# Patient Record
Sex: Female | Born: 1985
Health system: Southern US, Community
[De-identification: ages and names within clinical notes are randomized; demographics above are authoritative.]

## PROBLEM LIST (undated history)

## (undated) ENCOUNTER — Inpatient Hospital Stay (HOSPITAL_COMMUNITY): Payer: Self-pay

## (undated) DIAGNOSIS — E0590 Thyrotoxicosis, unspecified without thyrotoxic crisis or storm: Secondary | ICD-10-CM

## (undated) HISTORY — PX: NO PAST SURGERIES: SHX2092

---

## 2007-07-28 ENCOUNTER — Emergency Department (HOSPITAL_COMMUNITY): Admission: EM | Admit: 2007-07-28 | Discharge: 2007-07-29 | Payer: Self-pay | Admitting: Pathology

## 2008-10-19 ENCOUNTER — Other Ambulatory Visit: Admission: RE | Admit: 2008-10-19 | Discharge: 2008-10-19 | Payer: Self-pay | Admitting: Pediatrics

## 2009-12-01 ENCOUNTER — Other Ambulatory Visit: Admission: RE | Admit: 2009-12-01 | Discharge: 2009-12-01 | Payer: Self-pay | Admitting: Obstetrics and Gynecology

## 2010-11-18 ENCOUNTER — Other Ambulatory Visit: Payer: Self-pay | Admitting: Obstetrics and Gynecology

## 2010-11-18 ENCOUNTER — Other Ambulatory Visit (HOSPITAL_COMMUNITY)
Admission: RE | Admit: 2010-11-18 | Discharge: 2010-11-18 | Disposition: A | Discharge status: Home / Self Care | Payer: Commercial Indemnity | Source: Ambulatory Visit | Attending: Obstetrics and Gynecology | Admitting: Obstetrics and Gynecology

## 2010-11-18 DIAGNOSIS — Z113 Encounter for screening for infections with a predominantly sexual mode of transmission: Secondary | ICD-10-CM | POA: Insufficient documentation

## 2010-11-18 DIAGNOSIS — Z01419 Encounter for gynecological examination (general) (routine) without abnormal findings: Secondary | ICD-10-CM | POA: Insufficient documentation

## 2011-07-12 LAB — BASIC METABOLIC PANEL
CO2: 24
Calcium: 9.2
Chloride: 106
GFR calc Af Amer: >60
Sodium: 139

## 2011-07-12 LAB — URINALYSIS, ROUTINE W REFLEX MICROSCOPIC
Bilirubin Urine: NEGATIVE
Glucose, UA: NEGATIVE
Hgb urine dipstick: NEGATIVE
Nitrite: NEGATIVE
Protein, ur: NEGATIVE
Specific Gravity, Urine: 1.02
Urobilinogen, UA: 1
pH: 6

## 2011-07-12 LAB — PREGNANCY, URINE: Preg Test, Ur: NEGATIVE

## 2011-12-19 ENCOUNTER — Other Ambulatory Visit (HOSPITAL_COMMUNITY)
Admission: RE | Admit: 2011-12-19 | Discharge: 2011-12-19 | Disposition: A | Discharge status: Home / Self Care | Payer: Commercial Indemnity | Source: Ambulatory Visit | Attending: Obstetrics and Gynecology | Admitting: Obstetrics and Gynecology

## 2011-12-19 ENCOUNTER — Other Ambulatory Visit: Payer: Self-pay | Admitting: Obstetrics and Gynecology

## 2011-12-19 DIAGNOSIS — Z113 Encounter for screening for infections with a predominantly sexual mode of transmission: Secondary | ICD-10-CM | POA: Insufficient documentation

## 2011-12-19 DIAGNOSIS — Z01419 Encounter for gynecological examination (general) (routine) without abnormal findings: Secondary | ICD-10-CM | POA: Insufficient documentation

## 2014-02-05 ENCOUNTER — Other Ambulatory Visit: Payer: Self-pay | Admitting: Nurse Practitioner

## 2014-02-05 ENCOUNTER — Other Ambulatory Visit (HOSPITAL_COMMUNITY)
Admission: RE | Admit: 2014-02-05 | Discharge: 2014-02-05 | Disposition: A | Discharge status: Home / Self Care | Payer: BC Managed Care – PPO | Source: Ambulatory Visit | Attending: Obstetrics and Gynecology | Admitting: Obstetrics and Gynecology

## 2014-02-05 DIAGNOSIS — Z01419 Encounter for gynecological examination (general) (routine) without abnormal findings: Secondary | ICD-10-CM | POA: Insufficient documentation

## 2014-07-08 ENCOUNTER — Emergency Department (HOSPITAL_COMMUNITY)
Admission: EM | Admit: 2014-07-08 | Discharge: 2014-07-08 | Disposition: A | Discharge status: Home / Self Care | Payer: BC Managed Care – PPO | Attending: Emergency Medicine | Admitting: Emergency Medicine

## 2014-07-08 ENCOUNTER — Encounter (HOSPITAL_COMMUNITY): Payer: Self-pay | Admitting: Emergency Medicine

## 2014-07-08 DIAGNOSIS — Z79899 Other long term (current) drug therapy: Secondary | ICD-10-CM | POA: Insufficient documentation

## 2014-07-08 DIAGNOSIS — J029 Acute pharyngitis, unspecified: Secondary | ICD-10-CM | POA: Diagnosis not present

## 2014-07-08 DIAGNOSIS — M549 Dorsalgia, unspecified: Secondary | ICD-10-CM | POA: Insufficient documentation

## 2014-07-08 LAB — RAPID STREP SCREEN (MED CTR MEBANE ONLY): Streptococcus, Group A Screen (Direct): NEGATIVE

## 2014-07-08 MED ORDER — HYDROCOD POLST-CHLORPHEN POLST 10-8 MG/5ML PO LQCR: 5.0000 mL | Freq: Two times a day (BID) | ORAL | Status: DC | PRN

## 2014-07-08 NOTE — ED Notes (Signed)
Pt sts she has been experiencing a sore throat with ear pain and some back pain since Monday (10/5)

## 2014-07-08 NOTE — ED Provider Notes (Signed)
CSN: [id number]     Arrival date & time 07/08/14  1304 History  This chart was scribed for Oswaldo ConroyVictoria Keanthony Poole, [place] with Shon Batonourtney F Horton, MD by Tonye RoyaltyJoshua Chen, ED Scribe. This patient was seen in room WTR9/WTR9 and the patient's care was started at 2:58 PM.    Chief Complaint  Patient presents with  . Sore Throat   The history is provided by the patient. No language interpreter was used.   HPI Comments: Margaret Mason is a 28 y.o. female who presents to the Emergency Department complaining of sore throat with onset 2 days ago with associated right ear pain and soreness to her back. She reports pain with swallowing. She reports taking Ibuprofen that improved her back pain; she also reports using Chloraseptic. She also reports associated chills, fever, and non-productive cough. She denies rhinorrhea, chest pain, SOB, difficulty breathing, nausea, vomiting, or abdominal pain.  History reviewed. No pertinent past medical history. History reviewed. No pertinent past surgical history. History reviewed. No pertinent family history. History  Substance Use Topics  . Smoking status: Never Smoker   . Smokeless tobacco: Not on file  . Alcohol Use: Yes     Comment: socially   OB History   Grav Para Term Preterm Abortions TAB SAB Ect Mult Living                 Review of Systems  Constitutional: Positive for fever and chills.  HENT: Positive for ear pain and sore throat. Negative for rhinorrhea.   Respiratory: Positive for cough. Negative for shortness of breath.   Cardiovascular: Negative for chest pain.  Gastrointestinal: Negative for nausea, vomiting and abdominal pain.  Musculoskeletal: Positive for back pain.      Allergies  Review of patient's allergies indicates no known allergies.  Home Medications   Prior to Admission medications   Medication Sig Start Date End Date Taking? Authorizing Provider  Acetaminophen (CHLORASEPTIC SORE THROAT PO) Take 2 sprays by mouth 2 (two) times daily as  needed (sore throat).   Yes Historical Provider, MD  Drospirenone-Ethinyl Estradiol-Levomefol (BEYAZ) 3-0.02-0.451 MG tablet Take 1 tablet by mouth daily.   Yes Historical Provider, MD  ibuprofen (ADVIL,MOTRIN) 800 MG tablet Take 800 mg by mouth every 8 (eight) hours as needed for moderate pain (throat, ear & back pain).   Yes Historical Provider, MD  chlorpheniramine-HYDROcodone (TUSSIONEX PENNKINETIC ER) 10-8 MG/5ML LQCR Take 5 mLs by mouth every 12 (twelve) hours as needed for cough (Cough). 07/08/14   Benetta SparVictoria L Kimberli Winne, PA-C   BP 120/66  Pulse 109  Temp(Src) 99.9 F (37.7 C) (Oral)  Ht 5\' 1"  (1.549 m)  Wt 185 lb (83.915 kg)  BMI 34.97 kg/m2  SpO2 100%  LMP 06/27/2014 Physical Exam  Nursing note and vitals reviewed. Constitutional: She appears well-developed and well-nourished. No distress.  HENT:  Head: Normocephalic and atraumatic.  No trismus. No frontal or maxillary sinus tenderness, Tonsils erythematous, swollen, and with some pus. No tenderness under tongue. No erythema or bulging TMs, not tender.   Eyes: Conjunctivae are normal. Right eye exhibits no discharge. Left eye exhibits no discharge.  Neck:  No masses  Pulmonary/Chest: Effort normal. No respiratory distress.  Lymphadenopathy:    She has no cervical adenopathy.  Neurological: She is alert. Coordination normal.  Skin: She is not diaphoretic.  Psychiatric: She has a normal mood and affect. Her behavior is normal.    ED Course  Procedures (including critical care time) Labs Review Labs Reviewed  RAPID STREP SCREEN  CULTURE, GROUP A STREP    Imaging Review No results found.   EKG Interpretation None     DIAGNOSTIC STUDIES: Oxygen Saturation is 100% on room air, normal by my interpretation.    COORDINATION OF CARE: 3:05 PM Her strep test is negative, so I believe that this is most likely viral. Her throat exam is also consistent with viral pharyngitis. I discussed with her my plans to discharge with  instructions to sleep well, drink lots of fluids with electrolytes, and use cough syrup along with the medications she is already taking. I instructed her to return if she experiences acute headache, fever, or if her cough becomes productive. The patient agrees with the plan and has no further questions at this time.  Meds given in ED:  Medications - No data to display  Discharge Medication List as of 07/08/2014  3:09 PM    START taking these medications   Details  chlorpheniramine-HYDROcodone (TUSSIONEX PENNKINETIC ER) 10-8 MG/5ML LQCR Take 5 mLs by mouth every 12 (twelve) hours as needed for cough (Cough)., Starting 07/08/2014, Until Discontinued, Print          MDM   Final diagnoses:  Viral pharyngitis   Pt afebrile with minimal tonsillar exudate, negative strep. Presents with no cervical lymphadenopathy, & dysphagia; diagnosis of viral pharyngitis. No abx indicated. DC w symptomatic tx for pain  Pt does not appear dehydrated, but did discuss importance of water rehydration. Presentation non concerning for PTA or infxn spread to soft tissue. No trismus or uvula deviation. Specific return precautions discussed. Recommended PCP follow up.  Discussed return precautions with patient. Discussed all results and patient verbalizes understanding and agrees with plan.  I personally performed the services described in this documentation, which was scribed in my presence. The recorded information has been reviewed and is accurate.   Louann Sjogren, PA-C 07/08/14 2329

## 2014-07-08 NOTE — Discharge Instructions (Signed)
Return to the emergency room with worsening of symptoms, new symptoms or with symptoms that are concerning, especially fevers, productive cough with thick colored mucous or blood, stiff neck, nausea/vomiting, visual changes or slurred speech, chest pain, shortness of breath. Continue to use OTC pain medications.  Cough syrup Do not operate machinery, drive or drink alcohol while taking cough syrup.

## 2014-07-09 NOTE — ED Provider Notes (Signed)
Medical screening examination/treatment/procedure(s) were performed by non-physician practitioner and as supervising physician I was immediately available for consultation/collaboration.   EKG Interpretation None        Onita Pfluger F Roshaun Pound, MD 07/09/14 0656 

## 2014-07-10 LAB — CULTURE, GROUP A STREP

## 2017-05-13 ENCOUNTER — Emergency Department (HOSPITAL_COMMUNITY): Payer: Self-pay

## 2017-05-13 ENCOUNTER — Emergency Department (HOSPITAL_COMMUNITY)
Admission: EM | Admit: 2017-05-13 | Discharge: 2017-05-13 | Disposition: A | Discharge status: Home / Self Care | Payer: Self-pay | Attending: Emergency Medicine | Admitting: Emergency Medicine

## 2017-05-13 ENCOUNTER — Encounter (HOSPITAL_COMMUNITY): Payer: Self-pay

## 2017-05-13 DIAGNOSIS — R109 Unspecified abdominal pain: Secondary | ICD-10-CM | POA: Insufficient documentation

## 2017-05-13 DIAGNOSIS — Z3A01 Less than 8 weeks gestation of pregnancy: Secondary | ICD-10-CM | POA: Insufficient documentation

## 2017-05-13 LAB — COMPREHENSIVE METABOLIC PANEL
ALBUMIN: 3.8 g/dL (ref 3.5–5.0)
ALK PHOS: 58 U/L (ref 38–126)
ALT: 15 U/L (ref 14–54)
AST: 22 U/L (ref 15–41)
Anion gap: 8 (ref 5–15)
BILIRUBIN TOTAL: 0.6 mg/dL (ref 0.3–1.2)
BUN: 6 mg/dL (ref 6–20)
CALCIUM: 8.8 mg/dL — AB (ref 8.9–10.3)
CO2: 24 mmol/L (ref 22–32)
CREATININE: 0.81 mg/dL (ref 0.44–1.00)
Chloride: 108 mmol/L (ref 101–111)
GFR calc Af Amer: >60 mL/min (ref >60)
GLUCOSE: 92 mg/dL (ref 65–99)
POTASSIUM: 3.8 mmol/L (ref 3.5–5.1)
Sodium: 140 mmol/L (ref 135–145)
TOTAL PROTEIN: 8.2 g/dL — AB (ref 6.5–8.1)

## 2017-05-13 LAB — I-STAT BETA HCG BLOOD, ED (MC, WL, AP ONLY): I-stat hCG, quantitative: 265.3 m[IU]/mL — ABNORMAL HIGH (ref <5)

## 2017-05-13 LAB — CBC
HEMATOCRIT: 40.5 % (ref 36.0–46.0)
Hemoglobin: 14.4 g/dL (ref 12.0–15.0)
MCH: 29.1 pg (ref 26.0–34.0)
MCHC: 35.6 g/dL (ref 30.0–36.0)
MCV: 82 fL (ref 78.0–100.0)
PLATELETS: 396 10*3/uL (ref 150–400)
RBC: 4.94 MIL/uL (ref 3.87–5.11)
RDW: 15 % (ref 11.5–15.5)
WBC: 8.5 10*3/uL (ref 4.0–10.5)

## 2017-05-13 LAB — URINALYSIS, ROUTINE W REFLEX MICROSCOPIC
BILIRUBIN URINE: NEGATIVE
Glucose, UA: NEGATIVE mg/dL
Hgb urine dipstick: NEGATIVE
KETONES UR: NEGATIVE mg/dL
LEUKOCYTES UA: NEGATIVE
NITRITE: NEGATIVE
PH: 5 (ref 5.0–8.0)
PROTEIN: NEGATIVE mg/dL
Specific Gravity, Urine: 1.017 (ref 1.005–1.030)

## 2017-05-13 LAB — WET PREP, GENITAL
SPERM: NONE SEEN
Trich, Wet Prep: NONE SEEN
YEAST WET PREP: NONE SEEN

## 2017-05-13 LAB — LIPASE, BLOOD: Lipase: 22 U/L (ref 11–51)

## 2017-05-13 LAB — TSH: TSH: 3.361 u[IU]/mL (ref 0.350–4.500)

## 2017-05-13 LAB — HCG, QUANTITATIVE, PREGNANCY: hCG, Beta Chain, Quant, S: 291 m[IU]/mL — ABNORMAL HIGH (ref <5)

## 2017-05-13 MED ORDER — DICYCLOMINE HCL 10 MG PO CAPS: 10.0000 mg | ORAL_CAPSULE | Freq: Once | ORAL | Status: DC

## 2017-05-13 NOTE — Discharge Instructions (Signed)
As we discussed, you need to follow up with your OB/GYN in 2 days for repeat blood tests to make sure that it is increasing normally.  As your OB/GYN directed, take 81 mg of baby aspirin a day. He didn't find this at a department store.  Take Tylenol as directed for pain.  Return to the emergency department for any worsening abdominal pain, nausea/vomiting, fever, vaginal bleeding, vaginal discharge or any other worsening or concerning symptoms.

## 2017-05-13 NOTE — ED Notes (Signed)
US at bedside

## 2017-05-13 NOTE — ED Provider Notes (Signed)
[facility]-EMERGENCY DEPT Provider Note   CSN: [id number] Arrival date & time: 05/13/17  0865     History   Chief Complaint No chief complaint on file.   HPI Margaret Mason is a 31 y.o. female who presents with 2 days of suprapubic abdominal cramping. Patient states the pain is nonradiating and describes it as a "sharp, cramping," that is an 8/10. Patient states that she has taken Tylenol and ibuprofen with temporary improvement in symptoms. Patient denies any other alleviating or aggravating factors. She has been able to eat and drink without any difficulty. She's been tolerating by mouth. Patient states that pain is not associated with any food intake. Patient reports some mild nausea but states that she has been able to tolerate by mouth denies any vomiting. Patient reports her last menstrual cycle was 04/10/17. Patient is currently sexually active with one partner. They do not use any protection. Patient denies any history of STDs. Patient does not use any birth control. Patient denies any fevers/chills, chest pain, difficulty breathing, dysuria, hematuria, vomiting, difficulty eating, vaginal discharge, vaginal bleeding.  The history is provided by the patient.    History reviewed. No pertinent past medical history.  There are no active problems to display for this patient.   History reviewed. No pertinent surgical history.  OB History    No data available       Home Medications    Prior to Admission medications   Medication Sig Start Date End Date Taking? Authorizing Provider  acetaminophen (TYLENOL) 500 MG tablet Take 1,000 mg by mouth every 6 (six) hours as needed.   Yes [provider]    Family History No family history on file.  Social History Social History  Substance Use Topics  . Smoking status: Never Smoker  . Smokeless tobacco: Never Used  . Alcohol use No     Comment: socially     Allergies   Patient has no known allergies.   Review of  Systems Review of Systems  Constitutional: Negative for chills and fever.  HENT: Negative for congestion, rhinorrhea and sore throat.   Eyes: Negative for visual disturbance.  Respiratory: Negative for cough and shortness of breath.   Cardiovascular: Negative for chest pain.  Gastrointestinal: Positive for abdominal pain. Negative for blood in stool, diarrhea, nausea and vomiting.  Genitourinary: Negative for dysuria and hematuria.  Musculoskeletal: Negative for back pain and neck pain.  Skin: Negative for rash.  Neurological: Negative for dizziness, weakness, numbness and headaches.  Psychiatric/Behavioral: Negative for confusion.  All other systems reviewed and are negative.    Physical Exam Updated Vital Signs BP 138/87   Pulse 81   Temp 98.1 F (36.7 C) (Oral)   Resp 16   Ht 5\' 1"  (1.549 m)   Wt 104.3 kg (230 lb)   LMP 04/10/2017   SpO2 100%   BMI 43.46 kg/m   Physical Exam  Constitutional: She is oriented to person, place, and time. She appears well-developed and well-nourished.  Sitting comfortably on examination table  HENT:  Head: Normocephalic and atraumatic.  Mouth/Throat: Oropharynx is clear and moist and mucous membranes are normal.  Eyes: Pupils are equal, round, and reactive to light. Conjunctivae, EOM and lids are normal.  Neck: Full passive range of motion without pain.  Cardiovascular: Normal rate, regular rhythm, normal heart sounds and normal pulses.  Exam reveals no gallop and no friction rub.   No murmur heard. Pulmonary/Chest: Effort normal and breath sounds normal.  Abdominal: Soft. Normal appearance.  There is tenderness in the suprapubic area. There is no rigidity, no guarding and no CVA tenderness.  No CVA tenderness bilaterally.  Genitourinary: Vagina normal and uterus normal. Cervix exhibits no motion tenderness, no discharge and no friability. Right adnexum displays no tenderness. Left adnexum displays no tenderness. No bleeding in the vagina.  No vaginal discharge found.  Genitourinary Comments: The exam was performed with a chaperone present. Normal external female genitalia. No evidence of lesions, rashes, sores.  Musculoskeletal: Normal range of motion.  Neurological: She is alert and oriented to person, place, and time.  Skin: Skin is warm and dry. Capillary refill takes less than 2 seconds.  Psychiatric: She has a normal mood and affect. Her speech is normal.  Nursing note and vitals reviewed.    ED Treatments / Results  Labs (all labs ordered are listed, but only abnormal results are displayed) Labs Reviewed  WET PREP, GENITAL - Abnormal; Notable for the following:       Result Value   Clue Cells Wet Prep HPF POC PRESENT (*)    WBC, Wet Prep HPF POC MANY (*)    All other components within normal limits  COMPREHENSIVE METABOLIC PANEL - Abnormal; Notable for the following:    Calcium 8.8 (*)    Total Protein 8.2 (*)    All other components within normal limits  HCG, QUANTITATIVE, PREGNANCY - Abnormal; Notable for the following:    hCG, Beta Chain, Quant, S 291 (*)    All other components within normal limits  I-STAT BETA HCG BLOOD, ED ([facility], [facility], [facility] ONLY) - Abnormal; Notable for the following:    I-stat hCG, quantitative 265.3 (*)    All other components within normal limits  LIPASE, BLOOD  CBC  URINALYSIS, ROUTINE W REFLEX MICROSCOPIC  TSH  GC/CHLAMYDIA PROBE AMP ([place]) NOT AT [facility]    EKG  EKG Interpretation None       Radiology [place] Ob Comp < 14 Wks  Result Date: 05/13/2017 CLINICAL DATA:  Pelvic pain for 3 days, pregnant, quantitative beta HCG = 291 EXAM: OBSTETRIC <14 WK US AND TRANSVAGINAL OB US TECHNIQUE: Both transabdominal and transvaginal ultrasound examinations were performed for complete evaluation of the gestation as well as the maternal uterus, adnexal regions, and pelvic cul-de-sac. Transvaginal technique was performed to assess early pregnancy. COMPARISON:  None FINDINGS: Intrauterine  gestational sac: None identified Yolk sac:  N/A Embryo:  N/A Cardiac Activity: N/A Heart Rate: N/A  bpm Maternal uterus/adnexae: Prominent endometrial complex without gestational sac. No endometrial fluid. Trace free pelvic fluid. LEFT ovary normal size and morphology, 3.4 x 1.2 x 2.1 cm. RIGHT ovary measures 5.1 x 2.6 x 2.6 cm and contains a dominant follicle 1.7 cm diameter as well as a probable hemorrhagic corpus luteum 1.9 x 1.5 x 1.9 cm. No adnexal masses otherwise seen. IMPRESSION: No intrauterine gestation identified. Findings are compatible with pregnancy of unknown location. Differential diagnosis includes early intrauterine pregnancy too early to visualize, spontaneous abortion, and ectopic pregnancy. Serial quantitative beta HCG and or followup ultrasound recommended to definitively exclude ectopic pregnancy. Electronically Signed   By: Ulyses SouthwardMark  Boles M.D.   On: 05/13/2017 14:12   [place] Ob Transvaginal  Result Date: 05/13/2017 CLINICAL DATA:  Pelvic pain for 3 days, pregnant, quantitative beta HCG = 291 EXAM: OBSTETRIC <14 WK US AND TRANSVAGINAL OB US TECHNIQUE: Both transabdominal and transvaginal ultrasound examinations were performed for complete evaluation of the gestation as well as the maternal uterus, adnexal regions, and pelvic cul-de-sac.  Transvaginal technique was performed to assess early pregnancy. COMPARISON:  None FINDINGS: Intrauterine gestational sac: None identified Yolk sac:  N/A Embryo:  N/A Cardiac Activity: N/A Heart Rate: N/A  bpm Maternal uterus/adnexae: Prominent endometrial complex without gestational sac. No endometrial fluid. Trace free pelvic fluid. LEFT ovary normal size and morphology, 3.4 x 1.2 x 2.1 cm. RIGHT ovary measures 5.1 x 2.6 x 2.6 cm and contains a dominant follicle 1.7 cm diameter as well as a probable hemorrhagic corpus luteum 1.9 x 1.5 x 1.9 cm. No adnexal masses otherwise seen. IMPRESSION: No intrauterine gestation identified. Findings are compatible with  pregnancy of unknown location. Differential diagnosis includes early intrauterine pregnancy too early to visualize, spontaneous abortion, and ectopic pregnancy. Serial quantitative beta HCG and or followup ultrasound recommended to definitively exclude ectopic pregnancy. Electronically Signed   By: Ulyses Southward M.D.   On: 05/13/2017 14:12    Procedures Procedures (including critical care time)  Medications Ordered in ED Medications - No data to display   Initial Impression / Assessment and Plan / ED Course  I have reviewed the triage vital signs and the nursing notes.  Pertinent labs & imaging results that were available during my care of the patient were reviewed by me and considered in my medical decision making (see chart for details).     31 year old female who presents with 2 days of suprapubic abdominal cramping. Patient is afebrile, non-toxic appearing, sitting comfortably on examination table. Vital signs reviewed and stable. Mild tenderness to suprapubic region on abdominal exam. Otherwise unremarkable. Consider pregnancy versus UTI versus infectious etiology, though low suspicion given lack of infectious symptoms. Initial labs ordered at triage, including CMP, CBC, lipase, UA, i-STAT beta Quant.   Labs reviewed. I-STAT beta is positive. Will plan to get beta Quant for further evaluation. UA is negative. CMP is unremarkable. CBC unremarkable. Lipase unremarkable. Discussed results with patient. Will plan to do pelvic exam.  Pelvic exam as documented above. Not concerning for PID. Based on LMP, would be 4 weeks, 5 days. Given early pregnancy cannot be  confirm intrauterine pregnancy on U/S. Will plan to obtain ultrasound for further evaluation of any ovary pathology.   U/S reviewed. Cannot determine if there is a early intrauterine pregnancy given early findings. Cannot rule out ectopic and spontaneous abortion. Do not suspect ectopic pregnancy given reassuring vitals, well appearance  and pelvic exam findings. Discussed results with patient. We will plan to have her follow-up with OB/GYN in 2 days for repeat beta. Will plan to consult her OB/GYN, [facility] physicians.  Discussed with Dr. Dion [facility]) regarding workup in ED today. Agrees with plan to discharge patient and have her follow-up in 48 hours. We'll arrange for an appointment on 05/15/17 to have repeat beta. Will plan to evaluate patient in the office later this week. Instructed that patient be discharged home on 81 mg of baby aspirin daily.  Discussed plan with patient. Vital signs reviewed and stable. Patient has been able to tolerate by mouth in the emergency department without any difficulty. Patient is hemodynamically stable. Patient stable for discharge at this time. Instructed patient to call her OB/GYN to arrange for an appointment in 48 hours. Conservative therapies discussed. Strict return precautions discussed. Patient expresses understanding and agreement to plan.    Final Clinical Impressions(s) / ED Diagnoses   Final diagnoses:  Abdominal cramping  Less than [redacted] weeks gestation of pregnancy    New Prescriptions Discharge Medication List as of 05/13/2017  3:13 PM  Maxwell Caul, PA-C 05/13/17 1744    Charlynne Pander, MD 05/15/17 [phone]

## 2017-05-13 NOTE — ED Triage Notes (Signed)
Patient present with c/o abdominal pain started Friday. Pt state her pain is sharp and rate it 8/10.

## 2017-05-14 LAB — GC/CHLAMYDIA PROBE AMP (~~LOC~~) NOT AT ARMC
CHLAMYDIA, DNA PROBE: NEGATIVE
NEISSERIA GONORRHEA: NEGATIVE

## 2017-06-01 ENCOUNTER — Encounter (HOSPITAL_COMMUNITY): Payer: Self-pay | Admitting: *Deleted

## 2017-06-01 ENCOUNTER — Inpatient Hospital Stay (HOSPITAL_COMMUNITY): Payer: 59

## 2017-06-01 ENCOUNTER — Inpatient Hospital Stay (HOSPITAL_COMMUNITY)
Admission: AD | Admit: 2017-06-01 | Discharge: 2017-06-01 | Disposition: A | Discharge status: Home / Self Care | Payer: 59 | Source: Ambulatory Visit | Attending: Obstetrics & Gynecology | Admitting: Obstetrics & Gynecology

## 2017-06-01 DIAGNOSIS — R102 Pelvic and perineal pain: Secondary | ICD-10-CM | POA: Insufficient documentation

## 2017-06-01 DIAGNOSIS — Z79899 Other long term (current) drug therapy: Secondary | ICD-10-CM | POA: Diagnosis not present

## 2017-06-01 DIAGNOSIS — O26891 Other specified pregnancy related conditions, first trimester: Secondary | ICD-10-CM | POA: Diagnosis not present

## 2017-06-01 DIAGNOSIS — N898 Other specified noninflammatory disorders of vagina: Secondary | ICD-10-CM | POA: Diagnosis not present

## 2017-06-01 DIAGNOSIS — Z3A01 Less than 8 weeks gestation of pregnancy: Secondary | ICD-10-CM | POA: Diagnosis not present

## 2017-06-01 DIAGNOSIS — O209 Hemorrhage in early pregnancy, unspecified: Secondary | ICD-10-CM | POA: Diagnosis present

## 2017-06-01 HISTORY — DX: Thyrotoxicosis, unspecified without thyrotoxic crisis or storm: E05.90

## 2017-06-01 LAB — URINALYSIS, ROUTINE W REFLEX MICROSCOPIC
Bilirubin Urine: NEGATIVE
GLUCOSE, UA: NEGATIVE mg/dL
Ketones, ur: NEGATIVE mg/dL
Leukocytes, UA: NEGATIVE
Nitrite: NEGATIVE
PH: 5 (ref 5.0–8.0)
PROTEIN: NEGATIVE mg/dL
Specific Gravity, Urine: 1.015 (ref 1.005–1.030)

## 2017-06-01 LAB — HCG, QUANTITATIVE, PREGNANCY: HCG, BETA CHAIN, QUANT, S: 54508 m[IU]/mL — AB (ref <5)

## 2017-06-01 NOTE — Discharge Instructions (Signed)
Vaginal Bleeding During Pregnancy, First Trimester °A small amount of bleeding (spotting) from the vagina is common in early pregnancy. Sometimes the bleeding is normal and is not a problem, and sometimes it is a sign of something serious. Be sure to tell your doctor about any bleeding from your vagina right away. °Follow these instructions at home: °· Watch your condition for any changes. °· Follow your doctor's instructions about how active you can be. °· If you are on bed rest: °? You may need to stay in bed and only get up to use the bathroom. °? You may be allowed to do some activities. °? If you need help, make plans for someone to help you. °· Write down: °? The number of pads you use each day. °? How often you change pads. °? How soaked (saturated) your pads are. °· Do not use tampons. °· Do not douche. °· Do not have sex or orgasms until your doctor says it is okay. °· If you pass any tissue from your vagina, save the tissue so you can show it to your doctor. °· Only take medicines as told by your doctor. °· Do not take aspirin because it can make you bleed. °· Keep all follow-up visits as told by your doctor. °Contact a doctor if: °· You bleed from your vagina. °· You have cramps. °· You have labor pains. °· You have a fever that does not go away after you take medicine. °Get help right away if: °· You have very bad cramps in your back or belly (abdomen). °· You pass large clots or tissue from your vagina. °· You bleed more. °· You feel light-headed or weak. °· You pass out (faint). °· You have chills. °· You are leaking fluid or have a gush of fluid from your vagina. °· You pass out while pooping (having a bowel movement). °This information is not intended to replace advice given to you by your health care provider. Make sure you discuss any questions you have with your health care provider. °Document Released: 02/02/2014 Document Revised: 02/24/2016 Document Reviewed: 05/26/2013 °Elsevier Interactive  Patient Education © 2018 [employer] °First Trimester of Pregnancy °The first trimester of pregnancy is from week 1 until the end of week 13 (months 1 through 3). A week after a sperm fertilizes an egg, the egg will implant on the wall of the uterus. This embryo will begin to develop into a baby. Genes from you and your partner will form the baby. The female genes will determine whether the baby will be a boy or a girl. At 6-8 weeks, the eyes and face will be formed, and the heartbeat can be seen on ultrasound. At the end of 12 weeks, all the baby's organs will be formed. °Now that you are pregnant, you will want to do everything you can to have a healthy baby. Two of the most important things are to get good prenatal care and to follow your health care provider's instructions. Prenatal care is all the medical care you receive before the baby's birth. This care will help prevent, find, and treat any problems during the pregnancy and childbirth. °Body changes during your first trimester °Your body goes through many changes during pregnancy. The changes vary from woman to woman. °· You may gain or lose a couple of pounds at first. °· You may feel sick to your stomach (nauseous) and you may throw up (vomit). If the vomiting is uncontrollable, call your health care provider. °· You   may tire easily. °· You may develop headaches that can be relieved by medicines. All medicines should be approved by your health care provider. °· You may urinate more often. Painful urination may mean you have a bladder infection. °· You may develop heartburn as a result of your pregnancy. °· You may develop constipation because certain hormones are causing the muscles that push stool through your intestines to slow down. °· You may develop hemorrhoids or swollen veins (varicose veins). °· Your breasts may begin to grow larger and become tender. Your nipples may stick out more, and the tissue that surrounds them (areola) may become  darker. °· Your gums may bleed and may be sensitive to brushing and flossing. °· Dark spots or blotches (chloasma, mask of pregnancy) may develop on your face. This will likely fade after the baby is born. °· Your menstrual periods will stop. °· You may have a loss of appetite. °· You may develop cravings for certain kinds of food. °· You may have changes in your emotions from day to day, such as being excited to be pregnant or being concerned that something may go wrong with the pregnancy and baby. °· You may have more vivid and strange dreams. °· You may have changes in your hair. These can include thickening of your hair, rapid growth, and changes in texture. Some women also have hair loss during or after pregnancy, or hair that feels dry or thin. Your hair will most likely return to normal after your baby is born. ° °What to expect at prenatal visits °During a routine prenatal visit: °· You will be weighed to make sure you and the baby are growing normally. °· Your blood pressure will be taken. °· Your abdomen will be measured to track your baby's growth. °· The fetal heartbeat will be listened to between weeks 10 and 14 of your pregnancy. °· Test results from any previous visits will be discussed. ° °Your health care provider may ask you: °· How you are feeling. °· If you are feeling the baby move. °· If you have had any abnormal symptoms, such as leaking fluid, bleeding, severe headaches, or abdominal cramping. °· If you are using any tobacco products, including cigarettes, chewing tobacco, and electronic cigarettes. °· If you have any questions. ° °Other tests that may be performed during your first trimester include: °· Blood tests to find your blood type and to check for the presence of any previous infections. The tests will also be used to check for low iron levels (anemia) and protein on red blood cells (Rh antibodies). Depending on your risk factors, or if you previously had diabetes during pregnancy,  you may have tests to check for high blood sugar that affects pregnant women (gestational diabetes). °· Urine tests to check for infections, diabetes, or protein in the urine. °· An ultrasound to confirm the proper growth and development of the baby. °· Fetal screens for spinal cord problems (spina bifida) and Down syndrome. °· HIV (human immunodeficiency virus) testing. Routine prenatal testing includes screening for HIV, unless you choose not to have this test. °· You may need other tests to make sure you and the baby are doing well. ° °Follow these instructions at home: °Medicines °· Follow your health care provider's instructions regarding medicine use. Specific medicines may be either safe or unsafe to take during pregnancy. °· Take a prenatal vitamin that contains at least 600 micrograms (mcg) of folic acid. °· If you develop constipation, try taking a   stool softener if your health care provider approves. °Eating and drinking °· Eat a balanced diet that includes fresh fruits and vegetables, whole grains, good sources of protein such as meat, eggs, or tofu, and low-fat dairy. Your health care provider will help you determine the amount of weight gain that is right for you. °· Avoid raw meat and uncooked cheese. These carry germs that can cause birth defects in the baby. °· Eating four or five small meals rather than three large meals a day may help relieve nausea and vomiting. If you start to feel nauseous, eating a few soda crackers can be helpful. Drinking liquids between meals, instead of during meals, also seems to help ease nausea and vomiting. °· Limit foods that are high in fat and processed sugars, such as fried and sweet foods. °· To prevent constipation: °? Eat foods that are high in fiber, such as fresh fruits and vegetables, whole grains, and beans. °? Drink enough fluid to keep your urine clear or pale yellow. °Activity °· Exercise only as directed by your health care provider. Most women can  continue their usual exercise routine during pregnancy. Try to exercise for 30 minutes at least 5 days a week. Exercising will help you: °? Control your weight. °? Stay in shape. °? Be prepared for labor and delivery. °· Experiencing pain or cramping in the lower abdomen or lower back is a good sign that you should stop exercising. Check with your health care provider before continuing with normal exercises. °· Try to avoid standing for long periods of time. Move your legs often if you must stand in one place for a long time. °· Avoid heavy lifting. °· Wear low-heeled shoes and practice good posture. °· You may continue to have sex unless your health care provider tells you not to. °Relieving pain and discomfort °· Wear a good support bra to relieve breast tenderness. °· Take warm sitz baths to soothe any pain or discomfort caused by hemorrhoids. Use hemorrhoid cream if your health care provider approves. °· Rest with your legs elevated if you have leg cramps or low back pain. °· If you develop varicose veins in your legs, wear support hose. Elevate your feet for 15 minutes, 3-4 times a day. Limit salt in your diet. °Prenatal care °· Schedule your prenatal visits by the twelfth week of pregnancy. They are usually scheduled monthly at first, then more often in the last 2 months before delivery. °· Write down your questions. Take them to your prenatal visits. °· Keep all your prenatal visits as told by your health care provider. This is important. °Safety °· Wear your seat belt at all times when driving. °· Make a list of emergency phone numbers, including numbers for family, friends, the hospital, and police and fire departments. °General instructions °· Ask your health care provider for a referral to a local prenatal education class. Begin classes no later than the beginning of month 6 of your pregnancy. °· Ask for help if you have counseling or nutritional needs during pregnancy. Your health care provider can offer  advice or refer you to specialists for help with various needs. °· Do not use hot tubs, steam rooms, or saunas. °· Do not douche or use tampons or scented sanitary pads. °· Do not cross your legs for long periods of time. °· Avoid cat litter boxes and soil used by cats. These carry germs that can cause birth defects in the baby and possibly loss of the fetus by   miscarriage or stillbirth. °· Avoid all smoking, herbs, alcohol, and medicines not prescribed by your health care provider. Chemicals in these products affect the formation and growth of the baby. °· Do not use any products that contain nicotine or tobacco, such as cigarettes and e-cigarettes. If you need help quitting, ask your health care provider. You may receive counseling support and other resources to help you quit. °· Schedule a dentist appointment. At home, brush your teeth with a soft toothbrush and be gentle when you floss. °Contact a health care provider if: °· You have dizziness. °· You have mild pelvic cramps, pelvic pressure, or nagging pain in the abdominal area. °· You have persistent nausea, vomiting, or diarrhea. °· You have a bad smelling vaginal discharge. °· You have pain when you urinate. °· You notice increased swelling in your face, hands, legs, or ankles. °· You are exposed to fifth disease or chickenpox. °· You are exposed to German measles (rubella) and have never had it. °Get help right away if: °· You have a fever. °· You are leaking fluid from your vagina. °· You have spotting or bleeding from your vagina. °· You have severe abdominal cramping or pain. °· You have rapid weight gain or loss. °· You vomit blood or material that looks like coffee grounds. °· You develop a severe headache. °· You have shortness of breath. °· You have any kind of trauma, such as from a fall or a car accident. °Summary °· The first trimester of pregnancy is from week 1 until the end of week 13 (months 1 through 3). °· Your body goes through many  changes during pregnancy. The changes vary from woman to woman. °· You will have routine prenatal visits. During those visits, your health care provider will examine you, discuss any test results you may have, and talk with you about how you are feeling. °This information is not intended to replace advice given to you by your health care provider. Make sure you discuss any questions you have with your health care provider. °Document Released: 09/12/2001 Document Revised: 08/30/2016 Document Reviewed: 08/30/2016 °Elsevier Interactive Patient Education © 2017 [employer] Inc. ° °

## 2017-06-01 NOTE — MAU Note (Signed)
Pt reports just prior to coming here she went to the restroom and had some bright red bleeding.

## 2017-06-01 NOTE — MAU Provider Note (Signed)
Chief Complaint: Vaginal Bleeding   First Provider Initiated Contact with Patient 06/01/17 1858        SUBJECTIVE HPI: Margaret Mason is a 31 y.o. G1P0 at Unknown by LMP who presents to maternity admissions reporting bleeding which started when she voided. States was soaking her clothes and "a lot in toilet".  Some cramping a week ago but none now.. She denies vaginal itching/burning, urinary symptoms, h/a, dizziness, n/v, or fever/chills.     Vaginal Bleeding  The patient's primary symptoms include vaginal bleeding. The patient's pertinent negatives include no genital itching, genital lesions, genital odor or pelvic pain. This is a new problem. The current episode started today. The problem has been unchanged. The patient is experiencing no pain. The problem affects both sides. She is pregnant. Pertinent negatives include no back pain, constipation, diarrhea, dysuria, fever, headaches, nausea or vomiting. The vaginal discharge was bloody. The vaginal bleeding is heavier than menses. She has not been passing clots. She has not been passing tissue. Nothing aggravates the symptoms. She has tried nothing for the symptoms.    RN Note: Pt reports just prior to coming here she went to the restroom and had some bright red bleeding.  No past medical history on file. No past surgical history on file. Social History   Social History  . Marital status: Single    Spouse name: N/A  . Number of children: N/A  . Years of education: N/A   Occupational History  . Not on file.   Social History Main Topics  . Smoking status: Never Smoker  . Smokeless tobacco: Never Used  . Alcohol use No     Comment: socially  . Drug use: No  . Sexual activity: Not on file   Other Topics Concern  . Not on file   Social History Narrative  . No narrative on file   No current facility-administered medications on file prior to encounter.    Current Outpatient Prescriptions on File Prior to Encounter   Medication Sig Dispense Refill  . acetaminophen (TYLENOL) 500 MG tablet Take 1,000 mg by mouth every 6 (six) hours as needed.     No Known Allergies  I have reviewed patient's Past Medical Hx, Surgical Hx, Family Hx, Social Hx, medications and allergies.   ROS:  Review of Systems  Constitutional: Negative for fever.  Gastrointestinal: Negative for constipation, diarrhea, nausea and vomiting.  Genitourinary: Positive for vaginal bleeding. Negative for dysuria and pelvic pain.  Musculoskeletal: Negative for back pain.  Neurological: Negative for headaches.   Review of Systems  Other systems negative   Physical Exam  Physical Exam Patient Vitals for the past 24 hrs:  BP Temp Temp src Pulse Resp SpO2  06/01/17 1848 (!) 145/90 98.5 F (36.9 C) Oral 90 18 100 %   Constitutional: Well-developed, well-nourished female in no acute distress.  Cardiovascular: normal rate Respiratory: normal effort GI: Abd soft, non-tender. Pos BS x 4 MS: Extremities nontender, no edema, normal ROM Neurologic: Alert and oriented x 4.  GU: Neg CVAT.  PELVIC EXAM: Cervix pink, visually closed, without lesion, small dark bloody discharge, vaginal walls and external genitalia normal   LAB RESULTS Results for orders placed or performed during the hospital encounter of 06/01/17 (from the past 24 hour(s))  hCG, quantitative, pregnancy     Status: Abnormal   Collection Time: 06/01/17  6:50 PM  Result Value Ref Range   hCG, Beta Chain, Quant, S 54,508 (H) <5 mIU/mL  Urinalysis, Routine w reflex microscopic  Status: Abnormal   Collection Time: 06/01/17  6:50 PM  Result Value Ref Range   Color, Urine YELLOW YELLOW   APPearance HAZY (A) CLEAR   Specific Gravity, Urine 1.015 1.005 - 1.030   pH 5.0 5.0 - 8.0   Glucose, UA NEGATIVE NEGATIVE mg/dL   Hgb urine dipstick LARGE (A) NEGATIVE   Bilirubin Urine NEGATIVE NEGATIVE   Ketones, ur NEGATIVE NEGATIVE mg/dL   Protein, ur NEGATIVE NEGATIVE mg/dL    Nitrite NEGATIVE NEGATIVE   Leukocytes, UA NEGATIVE NEGATIVE   RBC / HPF TOO NUMEROUS TO COUNT 0 - 5 RBC/hpf   WBC, UA 0-5 0 - 5 WBC/hpf   Bacteria, UA RARE (A) NONE SEEN   Squamous Epithelial / LPF 0-5 (A) NONE SEEN   Mucus PRESENT        IMAGING US Ob Comp < 14 Wks  Result Date: 05/13/2017 CLINICAL DATA:  Pelvic pain for 3 days, pregnant, quantitative beta HCG = 291 EXAM: OBSTETRIC <14 WK [place] AND TRANSVAGINAL OB US TECHNIQUE: Both transabdominal and transvaginal ultrasound examinations were performed for complete evaluation of the gestation as well as the maternal uterus, adnexal regions, and pelvic cul-de-sac. Transvaginal technique was performed to assess early pregnancy. COMPARISON:  None FINDINGS: Intrauterine gestational sac: None identified Yolk sac:  N/A Embryo:  N/A Cardiac Activity: N/A Heart Rate: N/A  bpm Maternal uterus/adnexae: Prominent endometrial complex without gestational sac. No endometrial fluid. Trace free pelvic fluid. LEFT ovary normal size and morphology, 3.4 x 1.2 x 2.1 cm. RIGHT ovary measures 5.1 x 2.6 x 2.6 cm and contains a dominant follicle 1.7 cm diameter as well as a probable hemorrhagic corpus luteum 1.9 x 1.5 x 1.9 cm. No adnexal masses otherwise seen. IMPRESSION: No intrauterine gestation identified. Findings are compatible with pregnancy of unknown location. Differential diagnosis includes early intrauterine pregnancy too early to visualize, spontaneous abortion, and ectopic pregnancy. Serial quantitative beta HCG and or followup ultrasound recommended to definitively exclude ectopic pregnancy. Electronically Signed   By: Ulyses Southward M.D.   On: 05/13/2017 14:12   US Ob Transvaginal  Result Date: 06/01/2017 CLINICAL DATA:  Vaginal bleeding. Seven weeks and 3 days pregnant by last menstrual period. EXAM: TRANSVAGINAL OB ULTRASOUND TECHNIQUE: Transvaginal ultrasound was performed for complete evaluation of the gestation as well as the maternal uterus, adnexal  regions, and pelvic cul-de-sac. COMPARISON:  None. FINDINGS: Intrauterine gestational sac: Visualized Yolk sac:  Visualized Embryo:  Visualized Cardiac Activity: Visualized Heart Rate: 129 bpm CRL:   8.6  mm   6 w 5 d                  [place] EDC: 01/20/2018 Subchorionic hemorrhage:  None visualized. Maternal uterus/adnexae: Normal appearing right ovary containing a corpus luteum. Nonvisualized left ovary. Trace free peritoneal fluid. IMPRESSION: 1. Single live intrauterine gestation with an estimated gestational age of [redacted] weeks and 5 days. No complicating features. 2. Nonvisualized maternal left ovary. Electronically Signed   By: Beckie Salts M.D.   On: 06/01/2017 20:01   US Ob Transvaginal  Result Date: 05/13/2017 CLINICAL DATA:  Pelvic pain for 3 days, pregnant, quantitative beta HCG = 291 EXAM: OBSTETRIC <14 WK [place] AND TRANSVAGINAL OB US TECHNIQUE: Both transabdominal and transvaginal ultrasound examinations were performed for complete evaluation of the gestation as well as the maternal uterus, adnexal regions, and pelvic cul-de-sac. Transvaginal technique was performed to assess early pregnancy. COMPARISON:  None FINDINGS: Intrauterine gestational sac: None identified Yolk sac:  N/A Embryo:  N/A  Cardiac Activity: N/A Heart Rate: N/A  bpm Maternal uterus/adnexae: Prominent endometrial complex without gestational sac. No endometrial fluid. Trace free pelvic fluid. LEFT ovary normal size and morphology, 3.4 x 1.2 x 2.1 cm. RIGHT ovary measures 5.1 x 2.6 x 2.6 cm and contains a dominant follicle 1.7 cm diameter as well as a probable hemorrhagic corpus luteum 1.9 x 1.5 x 1.9 cm. No adnexal masses otherwise seen. IMPRESSION: No intrauterine gestation identified. Findings are compatible with pregnancy of unknown location. Differential diagnosis includes early intrauterine pregnancy too early to visualize, spontaneous abortion, and ectopic pregnancy. Serial quantitative beta HCG and or followup ultrasound recommended to  definitively exclude ectopic pregnancy. Electronically Signed   By: Ulyses SouthwardMark  Boles M.D.   On: 05/13/2017 14:12    MAU Management/MDM: Will check repeat ultrasound to rule out SAB  Consult Dr Charlotta Newtonzan with presentation, exam findings, and results.   Treatments in MAU included observation.   This bleeding/pain can represent a normal pregnancy with bleeding, spontaneous abortion or even an ectopic which can be life-threatening.  The process as listed above helps to determine which of these is present.  Patient notified of reassuring US Live single IUP noted at 1569w5d Patient and husband are thrilled Picture provided   ASSESSMENT SIUP at [id number] Bleeding in the first trimester  PLAN Discharge home Followup in office for prenatal care Pelvic rest  Pt stable at time of discharge. Encouraged to return here or to other Urgent Care/ED if she develops worsening of symptoms, increase in pain, fever, or other concerning symptoms.    Wynelle BourgeoisMarie Amadi Frady CNM, MSN Certified Nurse-Midwife 06/01/2017  6:59 PM

## 2017-06-11 LAB — OB RESULTS CONSOLE HEPATITIS B SURFACE ANTIGEN: Hepatitis B Surface Ag: NEGATIVE

## 2017-06-11 LAB — OB RESULTS CONSOLE HIV ANTIBODY (ROUTINE TESTING): HIV: NONREACTIVE

## 2017-06-11 LAB — OB RESULTS CONSOLE RUBELLA ANTIBODY, IGM
RUBELLA: IMMUNE
Rubella: IMMUNE

## 2017-06-11 LAB — OB RESULTS CONSOLE ABO/RH: RH Type: POSITIVE

## 2017-06-11 LAB — OB RESULTS CONSOLE ANTIBODY SCREEN: Antibody Screen: NEGATIVE

## 2017-06-11 LAB — OB RESULTS CONSOLE RPR: RPR: NONREACTIVE

## 2017-06-28 ENCOUNTER — Other Ambulatory Visit: Payer: Self-pay | Admitting: Obstetrics and Gynecology

## 2017-06-28 ENCOUNTER — Other Ambulatory Visit (HOSPITAL_COMMUNITY)
Admission: RE | Admit: 2017-06-28 | Discharge: 2017-06-28 | Disposition: A | Discharge status: Home / Self Care | Payer: 59 | Source: Ambulatory Visit | Attending: Obstetrics and Gynecology | Admitting: Obstetrics and Gynecology

## 2017-06-28 DIAGNOSIS — Z124 Encounter for screening for malignant neoplasm of cervix: Secondary | ICD-10-CM | POA: Insufficient documentation

## 2017-06-28 LAB — OB RESULTS CONSOLE GC/CHLAMYDIA
Chlamydia: NEGATIVE
Gonorrhea: NEGATIVE

## 2017-07-02 LAB — CYTOLOGY - PAP
DIAGNOSIS: NEGATIVE
HPV: NOT DETECTED

## 2017-12-18 LAB — OB RESULTS CONSOLE GBS: STREP GROUP B AG: POSITIVE

## 2018-01-02 ENCOUNTER — Encounter (HOSPITAL_COMMUNITY): Payer: Self-pay | Admitting: *Deleted

## 2018-01-02 ENCOUNTER — Other Ambulatory Visit: Payer: Self-pay

## 2018-01-02 ENCOUNTER — Inpatient Hospital Stay (HOSPITAL_COMMUNITY)
Admission: AD | Admit: 2018-01-02 | Discharge: 2018-01-06 | DRG: 788 | Disposition: A | Discharge status: Home / Self Care | Payer: 59 | Source: Ambulatory Visit | Attending: Obstetrics & Gynecology | Admitting: Obstetrics & Gynecology

## 2018-01-02 DIAGNOSIS — Z3A38 38 weeks gestation of pregnancy: Secondary | ICD-10-CM | POA: Diagnosis not present

## 2018-01-02 DIAGNOSIS — E039 Hypothyroidism, unspecified: Secondary | ICD-10-CM | POA: Diagnosis present

## 2018-01-02 DIAGNOSIS — O99824 Streptococcus B carrier state complicating childbirth: Secondary | ICD-10-CM | POA: Diagnosis present

## 2018-01-02 DIAGNOSIS — O99284 Endocrine, nutritional and metabolic diseases complicating childbirth: Secondary | ICD-10-CM | POA: Diagnosis present

## 2018-01-02 DIAGNOSIS — O4103X0 Oligohydramnios, third trimester, not applicable or unspecified: Principal | ICD-10-CM | POA: Diagnosis present

## 2018-01-02 DIAGNOSIS — O99214 Obesity complicating childbirth: Secondary | ICD-10-CM | POA: Diagnosis present

## 2018-01-02 DIAGNOSIS — O134 Gestational [pregnancy-induced] hypertension without significant proteinuria, complicating childbirth: Secondary | ICD-10-CM | POA: Diagnosis present

## 2018-01-02 DIAGNOSIS — E669 Obesity, unspecified: Secondary | ICD-10-CM | POA: Diagnosis present

## 2018-01-02 LAB — COMPREHENSIVE METABOLIC PANEL
ALT: 21 U/L (ref 14–54)
ANION GAP: 9 (ref 5–15)
AST: 24 U/L (ref 15–41)
Albumin: 2.7 g/dL — ABNORMAL LOW (ref 3.5–5.0)
Alkaline Phosphatase: 144 U/L — ABNORMAL HIGH (ref 38–126)
BUN: <5 mg/dL — ABNORMAL LOW (ref 6–20)
CHLORIDE: 107 mmol/L (ref 101–111)
CO2: 19 mmol/L — AB (ref 22–32)
Calcium: 8.9 mg/dL (ref 8.9–10.3)
Creatinine, Ser: 0.64 mg/dL (ref 0.44–1.00)
GFR calc non Af Amer: >60 mL/min (ref >60)
Glucose, Bld: 101 mg/dL — ABNORMAL HIGH (ref 65–99)
Potassium: 3.7 mmol/L (ref 3.5–5.1)
Sodium: 135 mmol/L (ref 135–145)
Total Bilirubin: 0.3 mg/dL (ref 0.3–1.2)
Total Protein: 6.3 g/dL — ABNORMAL LOW (ref 6.5–8.1)

## 2018-01-02 LAB — TYPE AND SCREEN
ABO/RH(D): AB POS
Antibody Screen: NEGATIVE

## 2018-01-02 LAB — CBC
HEMATOCRIT: 36.2 % (ref 36.0–46.0)
Hemoglobin: 12.4 g/dL (ref 12.0–15.0)
MCH: 27.4 pg (ref 26.0–34.0)
MCHC: 34.3 g/dL (ref 30.0–36.0)
MCV: 79.9 fL (ref 78.0–100.0)
Platelets: 383 10*3/uL (ref 150–400)
RBC: 4.53 MIL/uL (ref 3.87–5.11)
RDW: 16.6 % — ABNORMAL HIGH (ref 11.5–15.5)
WBC: 8.9 10*3/uL (ref 4.0–10.5)

## 2018-01-02 LAB — ABO/RH: ABO/RH(D): AB POS

## 2018-01-02 LAB — PROTEIN / CREATININE RATIO, URINE: CREATININE, URINE: 29 mg/dL

## 2018-01-02 MED ORDER — OXYTOCIN BOLUS FROM INFUSION: 500.0000 mL | Freq: Once | INTRAVENOUS | Status: DC

## 2018-01-02 MED ORDER — ONDANSETRON HCL 4 MG/2ML IJ SOLN
4.0000 mg | Freq: Four times a day (QID) | INTRAMUSCULAR | Status: DC | PRN
  Filled 2018-01-02: qty 2

## 2018-01-02 MED ORDER — LABETALOL HCL 5 MG/ML IV SOLN: 20.0000 mg | INTRAVENOUS | Status: DC | PRN

## 2018-01-02 MED ORDER — ACETAMINOPHEN 325 MG PO TABS: 650.0000 mg | ORAL_TABLET | ORAL | Status: DC | PRN

## 2018-01-02 MED ORDER — TERBUTALINE SULFATE 1 MG/ML IJ SOLN: 0.2500 mg | Freq: Once | INTRAMUSCULAR | Status: DC | PRN

## 2018-01-02 MED ORDER — OXYCODONE-ACETAMINOPHEN 5-325 MG PO TABS: 1.0000 | ORAL_TABLET | ORAL | Status: DC | PRN

## 2018-01-02 MED ORDER — OXYTOCIN 40 UNITS IN LACTATED RINGERS INFUSION - SIMPLE MED: 2.5000 [IU]/h | INTRAVENOUS | Status: DC

## 2018-01-02 MED ORDER — OXYCODONE-ACETAMINOPHEN 5-325 MG PO TABS: 2.0000 | ORAL_TABLET | ORAL | Status: DC | PRN

## 2018-01-02 MED ORDER — PENICILLIN G POT IN DEXTROSE 60000 UNIT/ML IV SOLN
3.0000 10*6.[IU] | INTRAVENOUS | Status: DC
  Administered 2018-01-02 – 2018-01-03 (×8): 3 10*6.[IU] via INTRAVENOUS
  Filled 2018-01-02 (×9): qty 50

## 2018-01-02 MED ORDER — SOD CITRATE-CITRIC ACID 500-334 MG/5ML PO SOLN
30.0000 mL | ORAL | Status: DC | PRN
  Administered 2018-01-03 (×2): 30 mL via ORAL
  Filled 2018-01-02 (×2): qty 15

## 2018-01-02 MED ORDER — MISOPROSTOL 25 MCG QUARTER TABLET
25.0000 ug | ORAL_TABLET | ORAL | Status: DC | PRN
  Administered 2018-01-02 (×2): 25 ug via VAGINAL
  Filled 2018-01-02 (×3): qty 1

## 2018-01-02 MED ORDER — HYDROXYZINE HCL 50 MG PO TABS: 50.0000 mg | ORAL_TABLET | Freq: Four times a day (QID) | ORAL | Status: DC | PRN

## 2018-01-02 MED ORDER — LIDOCAINE HCL (PF) 1 % IJ SOLN: 30.0000 mL | INTRAMUSCULAR | Status: DC | PRN

## 2018-01-02 MED ORDER — SODIUM CHLORIDE 0.9 % IV SOLN
5.0000 10*6.[IU] | Freq: Once | INTRAVENOUS | Status: AC
  Administered 2018-01-02: 5 10*6.[IU] via INTRAVENOUS
  Filled 2018-01-02: qty 5

## 2018-01-02 MED ORDER — BUTORPHANOL TARTRATE 1 MG/ML IJ SOLN
1.0000 mg | INTRAMUSCULAR | Status: DC | PRN
  Filled 2018-01-02: qty 1

## 2018-01-02 MED ORDER — LACTATED RINGERS IV SOLN
500.0000 mL | INTRAVENOUS | Status: DC | PRN
  Administered 2018-01-03: 500 mL via INTRAVENOUS

## 2018-01-02 MED ORDER — LACTATED RINGERS IV SOLN
INTRAVENOUS | Status: DC
  Administered 2018-01-02 – 2018-01-04 (×4): via INTRAVENOUS

## 2018-01-02 MED ORDER — HYDRALAZINE HCL 20 MG/ML IJ SOLN: 5.0000 mg | INTRAMUSCULAR | Status: DC | PRN

## 2018-01-02 MED ORDER — OXYTOCIN 40 UNITS IN LACTATED RINGERS INFUSION - SIMPLE MED
1.0000 m[IU]/min | INTRAVENOUS | Status: DC
  Administered 2018-01-03: 1 m[IU]/min via INTRAVENOUS
  Filled 2018-01-02: qty 1000

## 2018-01-02 MED ORDER — ZOLPIDEM TARTRATE 5 MG PO TABS: 5.0000 mg | ORAL_TABLET | Freq: Every evening | ORAL | Status: DC | PRN

## 2018-01-02 NOTE — Progress Notes (Signed)
LABOR PROGRESS NOTE  Margaret Mason is a 32 y.o. G2P0010 at 2579w1d  admitted for oligojydramnios ' gest htn  Subjective: Mild contractions; Foley bulb placed without difficulty cervix 2/80/-2  Objective: BP 121/65   Pulse 87   Temp 98.5 F (36.9 C) (Oral)   Resp 16   Ht 5\' 1"  (1.549 m)   Wt 233 lb 6.4 oz (105.9 kg)   LMP 04/10/2017   BMI 44.10 kg/m  or  Vitals:   01/02/18 1730 01/02/18 1840 01/02/18 1921 01/02/18 2034  BP: 135/83 130/75 136/86 121/65  Pulse: 94 90 88 87  Resp: 16 16    Temp: 98.5 F (36.9 C)  98.5 F (36.9 C)   TempSrc: Oral  Oral   Weight:      Height:        Cat 1 uc's q 2-4 minutes Dilation: 2 Effacement (%): 50 Cervical Position: Posterior Station: -3 Presentation: Vertex Exam by:: Lorn Junes. Goodman, RN  Labs: Lab Results  Component Value Date   WBC 8.9 01/02/2018   HGB 12.4 01/02/2018   HCT 36.2 01/02/2018   MCV 79.9 01/02/2018   PLT 383 01/02/2018    Patient Active Problem List   Diagnosis Date Noted  . Oligohydramnios in third trimester 01/02/2018    Assessment / Plan:   Labor: cervical ripening Fetal Wellbeing:  Cat 1 Pain Control:  na Anticipated MOD:  SVD  Lori A Clemmons CNM 01/02/2018, 9:42 PM

## 2018-01-02 NOTE — Anesthesia Pain Management Evaluation Note (Signed)
  CRNA Pain Management Visit Note  Patient: Margaret Mason, 32 y.o., female  "Hello [facility] am a [facility]. We have an anesthesia team available at all times to provide care throughout the hospital, including epidural management and anesthesia for C-section. I don't know your plan for the delivery whether it a natural birth, water birth, IV sedation, nitrous supplementation, doula or epidural, but we want to meet your pain goals."   1.Was your pain managed to your expectations on prior hospitalizations?   No prior hospitalizations  2.What is your expectation for pain management during this hospitalization?     IV pain meds  3.How can we help you reach that goal? unsure  Record the patient's initial score and the patient's pain goal.   Pain: 0  Pain Goal: 5 [facility] wants you to be able to say your pain was always managed very well.  Cephus ShellingBURGER,Brayden Betters 01/02/2018

## 2018-01-02 NOTE — H&P (Signed)
Margaret Mason is a 32 y.o. female G2 P0010 @ 6238 1/7 weeks presenting for IOL due to oligohydranios. 1 week ago an ultrasound was done for size greater than dates.  AFI at that time was 8.5, EFW 6 lbs 9 oz.  BP at that time was 120/90, Repeat was normal.  Preeclampsia labs were normal.  Preeclampsia w/u was negative. Pt presented to office today for 1 week f/u.  AFI=0, BP was 150/86.  Pt denied LOF, headache, RUQ or visual changes. Pt reported an active fetus.   PNC with [facility] Body(Kalli Greenfield) has been complicated by hypothyroidism, which has been well controlled with Synthroid. OB History    Gravida  2   Para      Term      Preterm      AB  1   Living        SAB  1   TAB      Ectopic      Multiple      Live Births             Past Medical History:  Diagnosis Date  . Overactive thyroid gland    Past Surgical History:  Procedure Laterality Date  . NO PAST SURGERIES     Family History: family history includes Asthma in her sister; Cancer in her maternal aunt; Heart disease in her mother. Social History:  reports that she has never smoked. She has never used smokeless tobacco. She reports that she does not drink alcohol or use drugs.     Maternal Diabetes: No Genetic Screening: Normal Maternal Ultrasounds/Referrals: Abnormal:  Findings:   Other:Oligohydramnios Fetal Ultrasounds or other Referrals:  None Maternal Substance Abuse:  No Significant Maternal Medications:  Meds include: Syntroid Significant Maternal Lab Results:  Lab values include: Group B Strep positive Other Comments: Gest HTN  Review of Systems  Eyes:       No visual changes.  Gastrointestinal: Negative for abdominal pain.  Neurological: Negative for headaches.   Maternal Medical History:  Fetal activity: Perceived fetal activity is normal.    Prenatal Complications - Diabetes: none.    Dilation: 2 Effacement (%): 50 Station: -2 Exam by:: Lorn Junes. Goodman, RN Blood pressure 128/84, pulse 97,  temperature 98.9 F (37.2 C), temperature source Oral, resp. rate 18, height 5\' 1"  (1.549 m), weight 105.9 kg (233 lb 6.4 oz), last menstrual period 04/10/2017. Maternal Exam:  Uterine Assessment: Contraction frequency is rare.   Abdomen: Patient reports no abdominal tenderness. Estimated fetal weight is 7 lb by Leopolds.   Fetal presentation: vertex  Introitus: Normal vulva. Normal vagina.  Ferning test: negative.   Pelvis: adequate for delivery.   Cervix: Cervix evaluated by digital exam.   1-2/50, -3, very posterior, soft, in office  Fetal Exam Fetal Monitor Review: Mode: fetoscope.   Variability: moderate (6-25 bpm).   Pattern: accelerations present and no decelerations.    Fetal State Assessment: Category I - tracings are normal.     Physical Exam  Constitutional: She is oriented to person, place, and time. She appears well-developed and well-nourished.  HENT:  Head: Normocephalic and atraumatic.  Eyes: EOM are normal.  Neck: Normal range of motion.  Respiratory: Effort normal. No respiratory distress.  GI: Soft. There is no tenderness.  Genitourinary:  Genitourinary Comments: Min amount of white discharge, no pooling  Musculoskeletal: Normal range of motion. She exhibits edema.  Neurological: She is alert and oriented to person, place, and time.  Skin: Skin is warm and  dry.  Psychiatric: She has a normal mood and affect.     CBC    Component Value Date/Time   WBC 8.9 01/02/2018 1308   RBC 4.53 01/02/2018 1308   HGB 12.4 01/02/2018 1308   HCT 36.2 01/02/2018 1308   PLT 383 01/02/2018 1308   MCV 79.9 01/02/2018 1308   MCH 27.4 01/02/2018 1308   MCHC 34.3 01/02/2018 1308   RDW 16.6 (H) 01/02/2018 1308   CMP     Component Value Date/Time   NA 135 01/02/2018 1308   K 3.7 01/02/2018 1308   CL 107 01/02/2018 1308   CO2 19 (L) 01/02/2018 1308   GLUCOSE 101 (H) 01/02/2018 1308   BUN <5 (L) 01/02/2018 1308   CREATININE 0.64 01/02/2018 1308   CALCIUM 8.9  01/02/2018 1308   PROT 6.3 (L) 01/02/2018 1308   ALBUMIN 2.7 (L) 01/02/2018 1308   AST 24 01/02/2018 1308   ALT 21 01/02/2018 1308   ALKPHOS 144 (H) 01/02/2018 1308   BILITOT 0.3 01/02/2018 1308   GFRNONAA >60 01/02/2018 1308   GFRAA >60 01/02/2018 1308    PCR protein not detected  Prenatal labs: ABO, Rh: --/--/AB POS, AB POS Performed at [facility], [address] [place], [place] [address]  [phone]/03 1308) Antibody: NEG (04/03 1308) Rubella: Immune, Immune (09/10 0000) RPR: Nonreactive (09/10 0000)  HBsAg: Negative (09/10 0000)  HIV: Non-reactive (09/10 0000)  GBS: Positive (03/19 0000)   Assessment/Plan: IUP @ 37 1/8 weeks Oligohydramnios w/ Intact Membranes Gest HTN-Neg Preeclampsia-  BPs now normal. Category I tracing.   IOL with Cytotec.  Consider Foley bulb to avoid contractions.   Pt counseled on amnioinfusion.if non reassuring fetal tracing and C-section for fetal intolerance to labor. Monitor BP Closely [facility] covering overnight.  Pt informed.    Geryl Rankins 01/02/2018, 5:22 PM

## 2018-01-02 NOTE — Anesthesia Pain Management Evaluation Note (Deleted)
  CRNA Pain Management Visit Note  Patient: Margaret Mason, 32 y.o., female  "Hello I am a member of the anesthesia team at [facility]. We have an anesthesia team available at all times to provide care throughout the hospital, including epidural management and anesthesia for C-section. I don't know your plan for the delivery whether it a natural birth, water birth, IV sedation, nitrous supplementation, doula or epidural, but we want to meet your pain goals."   1.Was your pain managed to your expectations on prior hospitalizations?   Yes   2.What is your expectation for pain management during this hospitalization?     Nitrous Oxide  3.How can we help you reach that goal? unsure  Record the patient's initial score and the patient's pain goal.   Pain: 3  Pain Goal: 7 [facility] wants you to be able to say your pain was always managed very well.  Cephus ShellingBURGER,Margaret Mason 01/02/2018

## 2018-01-03 ENCOUNTER — Encounter (HOSPITAL_COMMUNITY)
Admission: AD | Disposition: A | Discharge status: Home / Self Care | Payer: Self-pay | Source: Ambulatory Visit | Attending: Obstetrics & Gynecology

## 2018-01-03 ENCOUNTER — Encounter (HOSPITAL_COMMUNITY): Payer: Self-pay | Admitting: Anesthesiology

## 2018-01-03 ENCOUNTER — Inpatient Hospital Stay (HOSPITAL_COMMUNITY): Payer: 59 | Admitting: Anesthesiology

## 2018-01-03 LAB — CBC
HEMATOCRIT: 36.2 % (ref 36.0–46.0)
Hemoglobin: 12.4 g/dL (ref 12.0–15.0)
MCH: 27.3 pg (ref 26.0–34.0)
MCHC: 34.3 g/dL (ref 30.0–36.0)
MCV: 79.7 fL (ref 78.0–100.0)
PLATELETS: 380 10*3/uL (ref 150–400)
RBC: 4.54 MIL/uL (ref 3.87–5.11)
RDW: 16.7 % — AB (ref 11.5–15.5)
WBC: 12.7 10*3/uL — ABNORMAL HIGH (ref 4.0–10.5)

## 2018-01-03 LAB — RPR: RPR: NONREACTIVE

## 2018-01-03 SURGERY — Surgical Case
Anesthesia: Epidural | Wound class: Clean Contaminated

## 2018-01-03 MED ORDER — EPHEDRINE 5 MG/ML INJ
10.0000 mg | INTRAVENOUS | Status: AC | PRN
  Administered 2018-01-03 (×2): 10 mg via INTRAVENOUS

## 2018-01-03 MED ORDER — PHENYLEPHRINE 40 MCG/ML (10ML) SYRINGE FOR IV PUSH (FOR BLOOD PRESSURE SUPPORT)
PREFILLED_SYRINGE | INTRAVENOUS | Status: AC
  Filled 2018-01-03: qty 20

## 2018-01-03 MED ORDER — DEXAMETHASONE SODIUM PHOSPHATE 10 MG/ML IJ SOLN
INTRAMUSCULAR | Status: AC
  Filled 2018-01-03: qty 1

## 2018-01-03 MED ORDER — LIDOCAINE HCL (PF) 1 % IJ SOLN
INTRAMUSCULAR | Status: DC | PRN
  Administered 2018-01-03 (×2): 5 mL

## 2018-01-03 MED ORDER — SCOPOLAMINE 1 MG/3DAYS TD PT72
MEDICATED_PATCH | TRANSDERMAL | Status: AC
  Filled 2018-01-03: qty 1

## 2018-01-03 MED ORDER — OXYTOCIN 10 UNIT/ML IJ SOLN
INTRAMUSCULAR | Status: AC
Start: 2018-01-03 — End: 2018-01-03
  Filled 2018-01-03: qty 4

## 2018-01-03 MED ORDER — CEFAZOLIN SODIUM-DEXTROSE 2-3 GM-%(50ML) IV SOLR
INTRAVENOUS | Status: DC | PRN
  Administered 2018-01-03: 2 g via INTRAVENOUS

## 2018-01-03 MED ORDER — ONDANSETRON HCL 4 MG/2ML IJ SOLN
INTRAMUSCULAR | Status: AC
  Filled 2018-01-03: qty 2

## 2018-01-03 MED ORDER — SODIUM BICARBONATE 8.4 % IV SOLN
INTRAVENOUS | Status: DC | PRN
  Administered 2018-01-03 (×2): 5 mL via EPIDURAL

## 2018-01-03 MED ORDER — OXYTOCIN 40 UNITS IN LACTATED RINGERS INFUSION - SIMPLE MED
1.0000 m[IU]/min | INTRAVENOUS | Status: DC
  Filled 2018-01-03: qty 1000

## 2018-01-03 MED ORDER — FENTANYL 2.5 MCG/ML BUPIVACAINE 1/10 % EPIDURAL INFUSION (WH - ANES)
14.0000 mL/h | INTRAMUSCULAR | Status: DC | PRN
  Administered 2018-01-03 (×3): 14 mL/h via EPIDURAL
  Filled 2018-01-03 (×3): qty 100

## 2018-01-03 MED ORDER — EPHEDRINE 5 MG/ML INJ
10.0000 mg | INTRAVENOUS | Status: DC | PRN
  Administered 2018-01-03: 10 mg via INTRAVENOUS
  Filled 2018-01-03 (×2): qty 4

## 2018-01-03 MED ORDER — LACTATED RINGERS IV SOLN
INTRAVENOUS | Status: DC | PRN
  Administered 2018-01-03 (×2): via INTRAVENOUS

## 2018-01-03 MED ORDER — OXYTOCIN 10 UNIT/ML IJ SOLN
INTRAVENOUS | Status: DC | PRN
  Administered 2018-01-03: 40 [IU] via INTRAVENOUS

## 2018-01-03 MED ORDER — LEVOTHYROXINE SODIUM 88 MCG PO TABS
88.0000 ug | ORAL_TABLET | Freq: Every day | ORAL | Status: DC
  Administered 2018-01-03: 88 ug via ORAL
  Filled 2018-01-03 (×2): qty 1

## 2018-01-03 MED ORDER — MORPHINE SULFATE (PF) 0.5 MG/ML IJ SOLN
INTRAMUSCULAR | Status: DC | PRN
  Administered 2018-01-03: 3 mg via EPIDURAL

## 2018-01-03 MED ORDER — LACTATED RINGERS IV SOLN: 500.0000 mL | Freq: Once | INTRAVENOUS | Status: DC

## 2018-01-03 MED ORDER — CEFAZOLIN SODIUM-DEXTROSE 2-4 GM/100ML-% IV SOLN: 2.0000 g | Freq: Once | INTRAVENOUS | Status: DC

## 2018-01-03 MED ORDER — SCOPOLAMINE 1 MG/3DAYS TD PT72
MEDICATED_PATCH | TRANSDERMAL | Status: DC | PRN
  Administered 2018-01-03: 1 via TRANSDERMAL

## 2018-01-03 MED ORDER — PHENYLEPHRINE 40 MCG/ML (10ML) SYRINGE FOR IV PUSH (FOR BLOOD PRESSURE SUPPORT)
80.0000 ug | PREFILLED_SYRINGE | Freq: Once | INTRAVENOUS | Status: AC | PRN
  Administered 2018-01-03 (×3): 80 ug via INTRAVENOUS

## 2018-01-03 MED ORDER — LIDOCAINE-EPINEPHRINE (PF) 2 %-1:200000 IJ SOLN
INTRAMUSCULAR | Status: AC
  Filled 2018-01-03: qty 20

## 2018-01-03 MED ORDER — ONDANSETRON HCL 4 MG/2ML IJ SOLN
INTRAMUSCULAR | Status: DC | PRN
  Administered 2018-01-03: 4 mg via INTRAVENOUS

## 2018-01-03 MED ORDER — MORPHINE SULFATE (PF) 0.5 MG/ML IJ SOLN
INTRAMUSCULAR | Status: AC
  Filled 2018-01-03: qty 10

## 2018-01-03 MED ORDER — SODIUM BICARBONATE 8.4 % IV SOLN
INTRAVENOUS | Status: AC
  Filled 2018-01-03: qty 50

## 2018-01-03 MED ORDER — DIPHENHYDRAMINE HCL 50 MG/ML IJ SOLN
12.5000 mg | INTRAMUSCULAR | Status: DC | PRN
  Administered 2018-01-03 (×2): 12.5 mg via INTRAVENOUS
  Filled 2018-01-03: qty 1

## 2018-01-03 MED ORDER — PHENYLEPHRINE 40 MCG/ML (10ML) SYRINGE FOR IV PUSH (FOR BLOOD PRESSURE SUPPORT)
80.0000 ug | PREFILLED_SYRINGE | INTRAVENOUS | Status: AC | PRN
  Administered 2018-01-03 (×3): 80 ug via INTRAVENOUS
  Filled 2018-01-03 (×2): qty 10

## 2018-01-03 MED ORDER — DEXAMETHASONE SODIUM PHOSPHATE 10 MG/ML IJ SOLN
INTRAMUSCULAR | Status: DC | PRN
  Administered 2018-01-03: 10 mg via INTRAVENOUS

## 2018-01-03 MED ORDER — CEFAZOLIN SODIUM-DEXTROSE 2-4 GM/100ML-% IV SOLN
INTRAVENOUS | Status: AC
Start: 2018-01-03 — End: 2018-01-03
  Filled 2018-01-03: qty 100

## 2018-01-03 MED ORDER — PHENYLEPHRINE 40 MCG/ML (10ML) SYRINGE FOR IV PUSH (FOR BLOOD PRESSURE SUPPORT)
80.0000 ug | PREFILLED_SYRINGE | INTRAVENOUS | Status: AC | PRN
  Administered 2018-01-03: 80 ug via INTRAVENOUS
  Administered 2018-01-03: 400 ug via INTRAVENOUS
  Administered 2018-01-03: 80 ug via INTRAVENOUS

## 2018-01-03 SURGICAL SUPPLY — 43 items
BARRIER ADHS 3X4 INTERCEED (GAUZE/BANDAGES/DRESSINGS) ×2 IMPLANT
BENZOIN TINCTURE PRP APPL 2/3 (GAUZE/BANDAGES/DRESSINGS) ×2 IMPLANT
CHLORAPREP W/TINT 26ML (MISCELLANEOUS) ×2 IMPLANT
CLAMP CORD UMBIL (MISCELLANEOUS) IMPLANT
CLOTH BEACON ORANGE TIMEOUT ST (SAFETY) ×2 IMPLANT
DERMABOND ADVANCED (GAUZE/BANDAGES/DRESSINGS)
DERMABOND ADVANCED .7 DNX12 (GAUZE/BANDAGES/DRESSINGS) IMPLANT
DRSG OPSITE POSTOP 4X10 (GAUZE/BANDAGES/DRESSINGS) ×2 IMPLANT
ELECT REM PT RETURN 9FT ADLT (ELECTROSURGICAL) ×2
ELECTRODE REM PT RTRN 9FT ADLT (ELECTROSURGICAL) ×1 IMPLANT
EXTRACTOR VACUUM KIWI (MISCELLANEOUS) IMPLANT
GLOVE BIOGEL PI IND STRL 6.5 (GLOVE) ×1 IMPLANT
GLOVE BIOGEL PI IND STRL 7.0 (GLOVE) ×2 IMPLANT
GLOVE BIOGEL PI INDICATOR 6.5 (GLOVE) ×1
GLOVE BIOGEL PI INDICATOR 7.0 (GLOVE) ×2
GLOVE ECLIPSE 6.5 STRL STRAW (GLOVE) ×2 IMPLANT
GOWN STRL REUS W/TWL LRG LVL3 (GOWN DISPOSABLE) ×6 IMPLANT
HOVERMATT HALF SINGLE USE (PATIENT TRANSFER) ×2 IMPLANT
KIT ABG SYR 3ML LUER SLIP (SYRINGE) IMPLANT
NEEDLE HYPO 25X5/8 SAFETYGLIDE (NEEDLE) IMPLANT
NS IRRIG 1000ML POUR BTL (IV SOLUTION) ×2 IMPLANT
PACK C SECTION WH (CUSTOM PROCEDURE TRAY) ×2 IMPLANT
PAD ABD 7.5X8 STRL (GAUZE/BANDAGES/DRESSINGS) ×2 IMPLANT
PAD OB MATERNITY 4.3X12.25 (PERSONAL CARE ITEMS) ×2 IMPLANT
PENCIL SMOKE EVAC W/HOLSTER (ELECTROSURGICAL) ×2 IMPLANT
RETAINER VISCERAL (MISCELLANEOUS) ×2 IMPLANT
RETRACTOR TRAXI PANNICULUS (MISCELLANEOUS) ×1 IMPLANT
RTRCTR C-SECT PINK 25CM LRG (MISCELLANEOUS) ×2 IMPLANT
STRIP CLOSURE SKIN 1/2X4 (GAUZE/BANDAGES/DRESSINGS) ×2 IMPLANT
SUT PLAIN 0 NONE (SUTURE) IMPLANT
SUT PLAIN 2 0 XLH (SUTURE) ×2 IMPLANT
SUT VIC AB 0 CT1 27 (SUTURE) ×2
SUT VIC AB 0 CT1 27XBRD ANBCTR (SUTURE) ×2 IMPLANT
SUT VIC AB 0 CTX 36 (SUTURE) ×3
SUT VIC AB 0 CTX36XBRD ANBCTRL (SUTURE) ×3 IMPLANT
SUT VIC AB 2-0 CT1 27 (SUTURE) ×1
SUT VIC AB 2-0 CT1 TAPERPNT 27 (SUTURE) ×1 IMPLANT
SUT VIC AB 3-0 CT1 27 (SUTURE) ×3
SUT VIC AB 3-0 CT1 TAPERPNT 27 (SUTURE) ×3 IMPLANT
SUT VIC AB 4-0 KS 27 (SUTURE) ×2 IMPLANT
TOWEL OR 17X24 6PK STRL BLUE (TOWEL DISPOSABLE) ×2 IMPLANT
TRAXI PANNICULUS RETRACTOR (MISCELLANEOUS) ×1
TRAY FOLEY BAG SILVER LF 14FR (SET/KITS/TRAYS/PACK) IMPLANT

## 2018-01-03 NOTE — Anesthesia Preprocedure Evaluation (Signed)

## 2018-01-03 NOTE — Progress Notes (Signed)
OB PN:  S: Pt resting comfortably, no acute complaints.  At bedside to discuss management  O: BP 124/68   Pulse (!) 115   Temp 98.8 F (37.1 C) (Axillary)   Resp 18   Ht 5\' 1"  (1.549 m)   Wt 105.9 kg (233 lb 6.4 oz)   LMP 04/10/2017   SpO2 95%   BMI 44.10 kg/m   FHT: 140bpm, moderate variablity, + accels, no decels Toco: q3-524min SVE: deferred, IUPC in place  A/P: 32 y.o. G2P0010 @ 2646w2d for IOL due to oligohydramnios 1. FWB: Cat.I 2. Labor: continue Pit per protocol, IUPC placed for further titration.  Discussed plan- though pt has not made cervical change.  Contractions are inadequate- MVUs less than 200.  Discussed continued management vs C-section for arrest of descent.  Pt agreeable to continued with Pit- plan for recheck at 10pm- if still no further cervical change at that time will plan for C-section due to arrest of dilation Pain: continue epidural GBS: positive, continue PCN per protocol  Myna HidalgoJennifer Kabria Hetzer, DO [phone] (cell) [phone] (office)

## 2018-01-03 NOTE — Progress Notes (Signed)
OB PN:  S: Pt resting comfortably, no acute complaints  O: BP 99/63   Pulse (!) 119   Temp 98 F (36.7 C) (Oral)   Resp 16   Ht 5\' 1"  (1.549 m)   Wt 105.9 kg (233 lb 6.4 oz)   LMP 04/10/2017   SpO2 95%   BMI 44.10 kg/m   FHT: 150bpm, moderate variablity, + accels, occasional variable decels Toco: irregular SVE: deferred  A/P: 32 y.o. G2P0010 @ 2437w2d for IOL due to oligohydramnios 1. FWB: Cat. II- plan to continue to closely monitor, +accels and overal FHT reassuring 2. Labor: continue Pit per protocol Pain: continue epidural GBS: positive, continue PCN per protocol  Myna HidalgoJennifer Brandn Mcgath, DO [phone] (cell) [phone] (office)

## 2018-01-03 NOTE — Progress Notes (Signed)
OB PN:  S: Pt resting comfortably, no acute complaints  O: BP 120/66   Pulse (!) 105   Temp 97.6 F (36.4 C) (Oral)   Resp 20   Ht 5\' 1"  (1.549 m)   Wt 105.9 kg (233 lb 6.4 oz)   LMP 04/10/2017   SpO2 95%   BMI 44.10 kg/m   FHT: 135bpm, moderate variablity, + accels, no decels Toco: q3-456min SVE: 6/80/-2, AROM clear fluid, IUPC placed  A/P: 32 y.o. G2P0010 @ 1251w2d for IOL due to oligohydramnios 1. FWB: Cat.I 2. Labor: continue Pit per protocol, IUPC placed for further titration Pain: continue epidural GBS: positive, continue PCN per protocol  Margaret HidalgoJennifer Alysiah Suppa, DO [phone] (cell) [phone] (office)

## 2018-01-03 NOTE — Anesthesia Procedure Notes (Signed)
Epidural Patient location during procedure: OB  Staffing Anesthesiologist: Demetrias Goodbar, MD Performed: anesthesiologist   Preanesthetic Checklist Completed: patient identified, site marked, surgical consent, pre-op evaluation, timeout performed, IV checked, risks and benefits discussed and monitors and equipment checked  Epidural Patient position: sitting Prep: DuraPrep Patient monitoring: heart rate, continuous pulse ox and blood pressure Approach: right paramedian Location: L3-L4 Injection technique: LOR saline  Needle:  Needle type: Tuohy  Needle gauge: 17 G Needle length: 9 cm and 9 Needle insertion depth: 6 cm Catheter type: closed end flexible Catheter size: 20 Guage Catheter at skin depth: 10 cm Test dose: negative  Assessment Events: blood not aspirated, injection not painful, no injection resistance, negative IV test and no paresthesia  Additional Notes Patient identified. Risks/Benefits/Options discussed with patient including but not limited to bleeding, infection, nerve damage, paralysis, failed block, incomplete pain control, headache, blood pressure changes, nausea, vomiting, reactions to medication both or allergic, itching and postpartum back pain. Confirmed with bedside nurse the patient's most recent platelet count. Confirmed with patient that they are not currently taking any anticoagulation, have any bleeding history or any family history of bleeding disorders. Patient expressed understanding and wished to proceed. All questions were answered. Sterile technique was used throughout the entire procedure. Please see nursing notes for vital signs. Test dose was given through epidural needle and negative prior to continuing to dose epidural or start infusion. Warning signs of high block given to the patient including shortness of breath, tingling/numbness in hands, complete motor block, or any concerning symptoms with instructions to call for help. Patient was given  instructions on fall risk and not to get out of bed. All questions and concerns addressed with instructions to call with any issues.     

## 2018-01-03 NOTE — Progress Notes (Signed)
Strip reviewed Cat 1 

## 2018-01-03 NOTE — Progress Notes (Signed)
LABOR PROGRESS NOTE  Margaret Mason is a 32 y.o. G2P0010 at 6038w2d  admitted for oligohydramnios  Subjective: Called to room for pt in a prolonged decel after epidural placement d/t hypotension. Anesthesia called to room  Objective: BP (!) 109/58   Pulse (!) 117   Temp 98.9 F (37.2 C) (Oral)   Resp 16   Ht 5\' 1"  (1.549 m)   Wt 233 lb 6.4 oz (105.9 kg)   LMP 04/10/2017   SpO2 95%   BMI 44.10 kg/m  or  Vitals:   01/03/18 0607 01/03/18 0610 01/03/18 0615 01/03/18 0616  BP: (!) 93/41 (!) 110/58 115/60 (!) 109/58  Pulse: (!) 126 (!) 112 (!) 113 (!) 117  Resp:      Temp:      TempSrc:      SpO2:  95%    Weight:      Height:         Dilation: 6 Effacement (%): 80 Cervical Position: Posterior Station: -2 Presentation: Vertex Exam by:: Gearldine Bienenstockiana Castillo, RN  Labs: Lab Results  Component Value Date   WBC 12.7 (H) 01/03/2018   HGB 12.4 01/03/2018   HCT 36.2 01/03/2018   MCV 79.7 01/03/2018   PLT 380 01/03/2018    Patient Active Problem List   Diagnosis Date Noted  . Oligohydramnios in third trimester 01/02/2018    Assessment / Plan:  Oligohydramnios   [facility]  Lori A Clemmons CNM 01/03/2018, 6:25 AM

## 2018-01-03 NOTE — Progress Notes (Addendum)
OB PN:  S: Pt resting comfortably, no acute complaints.    O: BP 112/73   Pulse 96   Temp 99.1 F (37.3 C) (Axillary)   Resp 18   Ht 5\' 1"  (1.549 m)   Wt 105.9 kg (233 lb 6.4 oz)   LMP 04/10/2017   SpO2 95%   BMI 44.10 kg/m   FHT: 145bpm, moderate variablity, + accels, occasional variables Toco: q3-524min SVE: 6/90/-2, IUPC in place  A/P: 32 y.o. G2P0010 @ 2917w2d for IOL due to oligohydramnios 1. FWB: Cat.II 2. Labor: Pt has still made no further cervical change.  At this point, plan for C-section for arrest of dilation.Risk benefits and alternatives of cesarean section were discussed with the patient including but not limited to infection, bleeding, damage to bowel , bladder and baby with the need for further surgery. Pt voiced understanding and desires to proceed.  Pain: continue epidural GBS: positive, continue PCN per protocol  Myna HidalgoJennifer Joseguadalupe Stan, DO [phone] (cell) [phone] (office)

## 2018-01-04 ENCOUNTER — Encounter (HOSPITAL_COMMUNITY): Payer: Self-pay | Admitting: Obstetrics & Gynecology

## 2018-01-04 LAB — CBC
HCT: 35.2 % — ABNORMAL LOW (ref 36.0–46.0)
Hemoglobin: 12.1 g/dL (ref 12.0–15.0)
MCH: 27.6 pg (ref 26.0–34.0)
MCHC: 34.4 g/dL (ref 30.0–36.0)
MCV: 80.2 fL (ref 78.0–100.0)
PLATELETS: 343 10*3/uL (ref 150–400)
RBC: 4.39 MIL/uL (ref 3.87–5.11)
RDW: 16.9 % — AB (ref 11.5–15.5)
WBC: 25.5 10*3/uL — AB (ref 4.0–10.5)

## 2018-01-04 MED ORDER — OXYCODONE HCL 5 MG PO TABS: 5.0000 mg | ORAL_TABLET | Freq: Once | ORAL | Status: DC | PRN

## 2018-01-04 MED ORDER — DIBUCAINE 1 % RE OINT: 1.0000 "application " | TOPICAL_OINTMENT | RECTAL | Status: DC | PRN

## 2018-01-04 MED ORDER — OXYCODONE HCL 5 MG PO TABS: 5.0000 mg | ORAL_TABLET | ORAL | Status: DC | PRN

## 2018-01-04 MED ORDER — OXYTOCIN 40 UNITS IN LACTATED RINGERS INFUSION - SIMPLE MED: 2.5000 [IU]/h | INTRAVENOUS | Status: AC

## 2018-01-04 MED ORDER — PRENATAL MULTIVITAMIN CH
1.0000 | ORAL_TABLET | Freq: Every day | ORAL | Status: DC
  Administered 2018-01-06: 1 via ORAL
  Filled 2018-01-04 (×3): qty 1

## 2018-01-04 MED ORDER — MENTHOL 3 MG MT LOZG: 1.0000 | LOZENGE | OROMUCOSAL | Status: DC | PRN

## 2018-01-04 MED ORDER — ACETAMINOPHEN 325 MG PO TABS: 325.0000 mg | ORAL_TABLET | ORAL | Status: DC | PRN

## 2018-01-04 MED ORDER — HYDRALAZINE HCL 20 MG/ML IJ SOLN: 10.0000 mg | Freq: Once | INTRAMUSCULAR | Status: DC | PRN

## 2018-01-04 MED ORDER — OXYCODONE HCL 5 MG/5ML PO SOLN: 5.0000 mg | Freq: Once | ORAL | Status: DC | PRN

## 2018-01-04 MED ORDER — LACTATED RINGERS IV SOLN
INTRAVENOUS | Status: DC
Start: 2018-01-04 — End: 2018-01-06
  Administered 2018-01-04: 09:00:00 via INTRAVENOUS

## 2018-01-04 MED ORDER — SENNOSIDES-DOCUSATE SODIUM 8.6-50 MG PO TABS
2.0000 | ORAL_TABLET | ORAL | Status: DC
  Administered 2018-01-04 – 2018-01-06 (×2): 2 via ORAL
  Filled 2018-01-04 (×2): qty 2

## 2018-01-04 MED ORDER — MEPERIDINE HCL 25 MG/ML IJ SOLN: 6.2500 mg | INTRAMUSCULAR | Status: DC | PRN

## 2018-01-04 MED ORDER — TETANUS-DIPHTH-ACELL PERTUSSIS 5-2.5-18.5 LF-MCG/0.5 IM SUSP: 0.5000 mL | Freq: Once | INTRAMUSCULAR | Status: DC

## 2018-01-04 MED ORDER — DIPHENHYDRAMINE HCL 25 MG PO CAPS
25.0000 mg | ORAL_CAPSULE | Freq: Four times a day (QID) | ORAL | Status: DC | PRN
  Administered 2018-01-04: 25 mg via ORAL

## 2018-01-04 MED ORDER — ACETAMINOPHEN 325 MG PO TABS
650.0000 mg | ORAL_TABLET | ORAL | Status: DC | PRN
  Administered 2018-01-05 – 2018-01-06 (×5): 650 mg via ORAL
  Filled 2018-01-04 (×5): qty 2

## 2018-01-04 MED ORDER — ZOLPIDEM TARTRATE 5 MG PO TABS: 5.0000 mg | ORAL_TABLET | Freq: Every evening | ORAL | Status: DC | PRN

## 2018-01-04 MED ORDER — WITCH HAZEL-GLYCERIN EX PADS: 1.0000 "application " | MEDICATED_PAD | CUTANEOUS | Status: DC | PRN

## 2018-01-04 MED ORDER — COCONUT OIL OIL: 1.0000 "application " | TOPICAL_OIL | Status: DC | PRN

## 2018-01-04 MED ORDER — SIMETHICONE 80 MG PO CHEW
80.0000 mg | CHEWABLE_TABLET | Freq: Three times a day (TID) | ORAL | Status: DC
  Administered 2018-01-04 – 2018-01-06 (×6): 80 mg via ORAL
  Filled 2018-01-04 (×7): qty 1

## 2018-01-04 MED ORDER — SIMETHICONE 80 MG PO CHEW: 80.0000 mg | CHEWABLE_TABLET | ORAL | Status: DC | PRN

## 2018-01-04 MED ORDER — LABETALOL HCL 5 MG/ML IV SOLN
20.0000 mg | INTRAVENOUS | Status: DC | PRN
  Administered 2018-01-04: 20 mg via INTRAVENOUS

## 2018-01-04 MED ORDER — FENTANYL CITRATE (PF) 100 MCG/2ML IJ SOLN: 25.0000 ug | INTRAMUSCULAR | Status: DC | PRN

## 2018-01-04 MED ORDER — SIMETHICONE 80 MG PO CHEW
80.0000 mg | CHEWABLE_TABLET | ORAL | Status: DC
  Administered 2018-01-04 – 2018-01-06 (×2): 80 mg via ORAL
  Filled 2018-01-04 (×2): qty 1

## 2018-01-04 MED ORDER — IBUPROFEN 600 MG PO TABS
600.0000 mg | ORAL_TABLET | Freq: Four times a day (QID) | ORAL | Status: DC
  Administered 2018-01-04 – 2018-01-06 (×10): 600 mg via ORAL
  Filled 2018-01-04 (×10): qty 1

## 2018-01-04 MED ORDER — OXYCODONE HCL 5 MG PO TABS: 10.0000 mg | ORAL_TABLET | ORAL | Status: DC | PRN

## 2018-01-04 MED ORDER — LABETALOL HCL 5 MG/ML IV SOLN
INTRAVENOUS | Status: AC
  Filled 2018-01-04: qty 4

## 2018-01-04 MED ORDER — ONDANSETRON HCL 4 MG/2ML IJ SOLN
4.0000 mg | Freq: Once | INTRAMUSCULAR | Status: DC | PRN
Start: 2018-01-04 — End: 2018-01-04

## 2018-01-04 MED ORDER — ACETAMINOPHEN 160 MG/5ML PO SOLN: 325.0000 mg | ORAL | Status: DC | PRN

## 2018-01-04 NOTE — Lactation Note (Signed)
This note was copied from a baby's chart. Lactation Consultation Note  Patient Name: Margaret Mason ZOXWR'UToday's Date: 01/04/2018 Reason for consult: Initial assessment;Primapara;Early term 37-38.6wks Breastfeeding consultation services and support information given to patient.  Newborn is 4413 hours old and latching to breast without difficulty.  Reviewed basics and answered mom's questions.  Instructed to watch baby for feeding cues and call for assist prn.  Maternal Data Has patient been taught Hand Expression?: Yes Does the patient have breastfeeding experience prior to this delivery?: No  Feeding    LATCH Score                   Interventions    Lactation Tools Discussed/Used     Consult Status Consult Status: Follow-up Date: 01/05/18 Follow-up type: In-patient    Huston FoleyMOULDEN, Amika Tassin S 01/04/2018, 12:38 PM

## 2018-01-04 NOTE — Transfer of Care (Signed)
Immediate Anesthesia Transfer of Care Note  Patient: Margaret Mason  Procedure(s) Performed: CESAREAN SECTION (N/A )  Patient Location: PACU  Anesthesia Type:Epidural  Level of Consciousness: awake, alert , oriented and patient cooperative  Airway & Oxygen Therapy: Patient Spontanous Breathing  Post-op Assessment: Report given to RN and Post -op Vital signs reviewed and stable  Post vital signs: Reviewed and stable  Last Vitals:  Vitals Value Taken Time  BP    Temp    Pulse    Resp    SpO2      Last Pain:  Vitals:   01/03/18 1902  TempSrc: Axillary  PainSc:          Complications: No apparent anesthesia complications

## 2018-01-04 NOTE — Anesthesia Postprocedure Evaluation (Signed)
Anesthesia Post Note  Patient: Location managerDionne Mason  Procedure(s) Performed: CESAREAN SECTION (N/A )     Patient location during evaluation: Mother Baby Anesthesia Type: Epidural Level of consciousness: awake and alert Pain management: pain level controlled Vital Signs Assessment: post-procedure vital signs reviewed and stable Respiratory status: spontaneous breathing, nonlabored ventilation and respiratory function stable Cardiovascular status: stable Postop Assessment: no headache, no backache and epidural receding Anesthetic complications: no    Last Vitals:  Vitals:   01/04/18 0500 01/04/18 0600  BP: 115/63   Pulse: 86   Resp: 18   Temp: 37 C   SpO2: 98% 95%    Last Pain:  Vitals:   01/04/18 0500  TempSrc:   PainSc: 0-No pain   Pain Goal:                 [name]

## 2018-01-04 NOTE — Progress Notes (Addendum)
Subjective: Postop Day 1: Cesarean Delivery No complaints.  Pain controlled.  Lochia normal.  Breast feeding yes.  Objective: Temp:  [97.7 F (36.5 C)-99.1 F (37.3 C)] 98 F (36.7 C) (04/05 1210) Pulse Rate:  [79-143] 88 (04/05 1210) Resp:  [16-23] 17 (04/05 1210) BP: (96-162)/(46-123) 114/64 (04/05 1210) SpO2:  [95 %-100 %] 98 % (04/05 1210)  Physical Exam: Gen: NAD Lochia: Not visualized Uterine Fundus: firm, appropriately tender Incision:Honeycomb dressing moderately soiled. DVT Evaluation: + Edema present, no calf tenderness bilaterally   Recent Labs    01/03/18 0358 01/04/18 0613  HGB 12.4 12.1  HCT 36.2 35.2*    Assessment/Plan: Status post C-section-doing well postoperatively. Gestational HTN-  BP normal.  Continue to observe q 6 hours. Change dressing after 24 hours. Encouraged ambulation in halls. Lactation support. Circumcision today.  Couple consented.    Margaret Mason 01/04/2018, 1:22 PM

## 2018-01-04 NOTE — Plan of Care (Signed)
Pt. Will continue to improve 

## 2018-01-04 NOTE — Addendum Note (Signed)
Addendum  created 01/04/18 0814 by Angela AdamWrinkle, Toniann Dickerson G, CRNA   Sign clinical note

## 2018-01-04 NOTE — Anesthesia Postprocedure Evaluation (Signed)
Anesthesia Post Note  Patient: Location managerDionne Mason  Procedure(s) Performed: CESAREAN SECTION (N/A )     Patient location during evaluation: Mother Baby Anesthesia Type: Epidural Level of consciousness: awake and alert, oriented and patient cooperative Pain management: pain level controlled Vital Signs Assessment: post-procedure vital signs reviewed and stable Respiratory status: spontaneous breathing Cardiovascular status: stable Postop Assessment: no headache, epidural receding, patient able to bend at knees and no signs of nausea or vomiting Anesthetic complications: no Comments: Pain score 0.    Last Vitals:  Vitals:   01/04/18 0500 01/04/18 0600  BP: 115/63   Pulse: 86   Resp: 18   Temp: 37 C   SpO2: 98% 95%    Last Pain:  Vitals:   01/04/18 0500  TempSrc:   PainSc: 0-No pain   Pain Goal:                 [facility] Center[facility]INK[facility],Margaret Mason

## 2018-01-04 NOTE — Op Note (Signed)
PreOp Diagnosis: 1) Failed IOL 2) Oligohydramnios 3) Arrest of dilation 4) Obesity PostOp Diagnosis: same Procedure: Primary C-section Surgeon: Dr. Myna Hidalgo Assistant: Illene Bolus, CNM Anesthesia: epidural Complications: none EBL: 875cc UOP: 200cc Fluids: 2000cc  Findings: OP presentation female infant, normal uterus tubes and ovaries bilaterally  PROCEDURE:  Informed consent was obtained from the patient with risks, benefits, complications, treatment options, and expected outcomes discussed with the patient.  The patient concurred with the proposed plan, giving informed consent with form signed.   The patient was taken to Operating Room, and identified with the procedure verified as C-Section Delivery with Time Out. With induction of anesthesia, the patient was prepped and draped in the usual sterile fashion. A Pfannenstiel incision was made and carried down through the subcutaneous tissue to the fascia. The fascia was incised in the midline and extended transversely. The superior aspect of the fascial incision was grasped with Kochers elevated and the underlying muscle dissected off. The inferior aspect of the facial incision was in similar fashion, grasped elevated and rectus muscles dissected off. The peritoneum was identified and entered. Peritoneal incision was extended longitudinally.  Alexis retractor was placed. The utero-vesical peritoneal reflection was identified and incised transversely with the [facility] scissors, the incision extended laterally, the bladder flap created digitally. A low transverse uterine incision was made and the infants head delivered atraumatically. After the umbilical cord was clamped and cut cord blood was obtained for evaluation.   The placenta was removed intact and appeared normal. The uterine outline, tubes and ovaries appeared normal. The uterine incision was closed with running locked sutures of 0 Vicryl and a second layer of the same stitch was used in an  imbricating fashion.  Excellent hemostasis was obtained.  The pericolic gutters were then cleared of all clots and debris. Interceed was placed. The fascia was then reapproximated with running sutures of 0 Vicryl. The subcutaneous tissue was reapproximated with 2-0 plain gut suture.  The skin was closed with 4-0 vicryl in a subcuticular fashion.  Instrument, sponge, and needle counts were correct prior the abdominal closure and at the conclusion of the case. The patient was taken to recovery in stable condition.  Myna Hidalgo, DO [phone] (cell) [phone] (office)

## 2018-01-05 ENCOUNTER — Encounter (HOSPITAL_COMMUNITY): Payer: Self-pay | Admitting: *Deleted

## 2018-01-05 MED ORDER — HYDROCHLOROTHIAZIDE 25 MG PO TABS
25.0000 mg | ORAL_TABLET | Freq: Every day | ORAL | Status: DC
  Administered 2018-01-06: 25 mg via ORAL
  Filled 2018-01-05 (×3): qty 1

## 2018-01-05 NOTE — Plan of Care (Signed)
  Problem: Activity: Goal: Will verbalize the importance of balancing activity with adequate rest periods Outcome: Progressing   Problem: Coping: Goal: Ability to identify and utilize available resources and services will improve Outcome: Progressing   Problem: Life Cycle: Goal: Chance of risk for complications during the postpartum period will decrease Outcome: Progressing

## 2018-01-05 NOTE — Progress Notes (Signed)
Subjective: Postpartum Day 2: Cesarean Delivery Patient reports tolerating PO, + flatus and no problems voiding.  She also c/o significant swelling in LEs, greater than what she had during the pregnancy.  Otherwise she states she is doing well.   Objective: Vital signs in last 24 hours: Temp:  [98.1 F (36.7 C)-100 F (37.8 C)] 98.8 F (37.1 C) (04/06 1208) Pulse Rate:  [89-114] 101 (04/06 1208) Resp:  [16-18] 16 (04/06 1208) BP: (117-135)/(65-95) 117/65 (04/06 1208) SpO2:  [99 %-100 %] 100 % (04/06 16100611)  Physical Exam:  General: alert and no distress Lochia: appropriate Uterine Fundus: firm Incision: dressing intact DVT Evaluation: No evidence of DVT seen on physical exam.  Recent Labs    01/03/18 0358 01/04/18 0613  HGB 12.4 12.1  HCT 36.2 35.2*    Assessment/Plan: Status post Cesarean section. Doing well postoperatively.  Continue current care. HCTZ for LE edema Anticipate discharge tomorrow  Purcell Nailsngela Y Virna Livengood 01/05/2018, 2:10 PM

## 2018-01-06 ENCOUNTER — Encounter (HOSPITAL_COMMUNITY): Payer: Self-pay | Admitting: *Deleted

## 2018-01-06 MED ORDER — IBUPROFEN 600 MG PO TABS: 600.0000 mg | ORAL_TABLET | Freq: Four times a day (QID) | ORAL | 0 refills | Status: DC

## 2018-01-06 MED ORDER — HYDROCHLOROTHIAZIDE 25 MG PO TABS: 25.0000 mg | ORAL_TABLET | Freq: Every day | ORAL | 0 refills | Status: DC

## 2018-01-06 MED ORDER — OXYCODONE HCL 5 MG PO TABS: 5.0000 mg | ORAL_TABLET | ORAL | 0 refills | Status: DC | PRN

## 2018-01-06 NOTE — Discharge Summary (Signed)
OB Discharge Summary     Patient Name: Margaret Mason DOB: 09/28/1986 MRN: [id number]  Date of admission: 01/02/2018 Delivering MD: Myna HidalgoZAN, JENNIFER   Date of discharge: 01/06/2018  Admitting diagnosis: INDUCTION Intrauterine pregnancy: 531w5d     Secondary diagnosis:  Active Problems:   Oligohydramnios in third trimester  Additional problems:Hypothyroidism     Discharge diagnosis: Term Pregnancy Delivered                                                                                                Post partum procedures:None  Augmentation: AROM, Pitocin, Cytotec and Foley Balloon  Complications: None  Hospital course:  Induction of Labor With Cesarean Section  32 y.o. yo G2P0010 at 5131w5d was admitted to the hospital 01/02/2018 for induction of labor. Patient had a labor course significant for Cat 2 strip. The patient went for cesarean section due to Arrest of Descent, and delivered a Viable infant,01/03/2018  Membrane Rupture Time/Date: 1:04 PM ,01/03/2018   Details of operation can be found in separate operative Note.  Patient had an uncomplicated postpartum course. She is ambulating, tolerating a regular diet, passing flatus, and urinating well.  Patient is discharged home in stable condition on 01/06/18.                                    Physical exam  Vitals:   01/05/18 1208 01/05/18 1839 01/06/18 0022 01/06/18 0540  BP: 117/65 122/87 126/80 119/77  Pulse: (!) 101 92 89 88  Resp: 16 17 18 18   Temp: 98.8 F (37.1 C) 97.6 F (36.4 C) 98.4 F (36.9 C) 98.4 F (36.9 C)  TempSrc: Oral Oral Oral   SpO2:   99% 95%  Weight:      Height:       General: alert, cooperative and no distress Lochia: appropriate Uterine Fundus: firm Incision: Dressing is clean, dry, and intact DVT Evaluation: No evidence of DVT seen on physical exam. Negative Homan's sign. Labs: Lab Results  Component Value Date   WBC 25.5 (H) 01/04/2018   HGB 12.1 01/04/2018   HCT 35.2 (L) 01/04/2018   MCV  80.2 01/04/2018   PLT 343 01/04/2018   CMP Latest Ref Rng & Units 01/02/2018  Glucose 65 - 99 mg/dL 409(W101(H)  BUN 6 - 20 mg/dL <1(X<5(L)  Creatinine 9.140.44 - 1.00 mg/dL 7.820.64  Sodium 956135 - 213145 mmol/L 135  Potassium 3.5 - 5.1 mmol/L 3.7  Chloride 101 - 111 mmol/L 107  CO2 22 - 32 mmol/L 19(L)  Calcium 8.9 - 10.3 mg/dL 8.9  Total Protein 6.5 - 8.1 g/dL 6.3(L)  Total Bilirubin 0.3 - 1.2 mg/dL 0.3  Alkaline Phos 38 - 126 U/L 144(H)  AST 15 - 41 U/L 24  ALT 14 - 54 U/L 21    Discharge instruction: per After Visit Summary and "Baby and Me Booklet".  After visit meds:  Allergies as of 01/06/2018   No Known Allergies     Medication List    STOP taking these medications  aspirin 81 MG chewable tablet     TAKE these medications   acetaminophen 500 MG tablet Commonly known as:  TYLENOL Take 1,000 mg by mouth every 6 (six) hours as needed for moderate pain.   ibuprofen 600 MG tablet Commonly known as:  ADVIL,MOTRIN Take 1 tablet (600 mg total) by mouth every 6 (six) hours.   levothyroxine 88 MCG tablet Commonly known as:  SYNTHROID, LEVOTHROID Take 88 mcg by mouth daily before breakfast.   oxyCODONE 5 MG immediate release tablet Commonly known as:  Oxy IR/ROXICODONE Take 1 tablet (5 mg total) by mouth every 4 (four) hours as needed (pain scale 4-7).   oxymetazoline 0.05 % nasal spray Commonly known as:  AFRIN Place 1 spray into both nostrils 2 (two) times daily as needed for congestion.   prenatal multivitamin Tabs tablet Take 1 tablet by mouth daily at 12 noon.       Diet: routine diet  Activity: Advance as tolerated. Pelvic rest for 6 weeks.   Outpatient follow up:2 weeks Follow up Appt:No future appointments. Follow up Visit:No follow-ups on file.  Postpartum contraception: Combination OCPs  Newborn Data: Live born female  Birth Weight: 7 lb 1.9 oz (3230 g) APGAR: 8, 9  Newborn Delivery   Birth date/time:  01/03/2018 23:31:00 Delivery type:  C-Section, Low  Transverse Trial of labor:  No C-section categorization:  Primary     Baby Feeding: Bottle Disposition:home with mother   01/06/2018 Kenney Houseman, CNM

## 2018-04-28 IMAGING — US US OB TRANSVAGINAL
1 series · 15 of 28 positions shown · non-contrast
Comparison: None.

CLINICAL DATA: Vaginal bleeding. Seven weeks and 3 days pregnant by
last menstrual period.

EXAM:
TRANSVAGINAL OB ULTRASOUND
TECHNIQUE: Transvaginal ultrasound was performed for complete evaluation of the
gestation as well as the maternal uterus, adnexal regions, and
pelvic cul-de-sac.

[Series 1: us ob transvaginal · 15 of 33 slices shown]
[im 1/33]
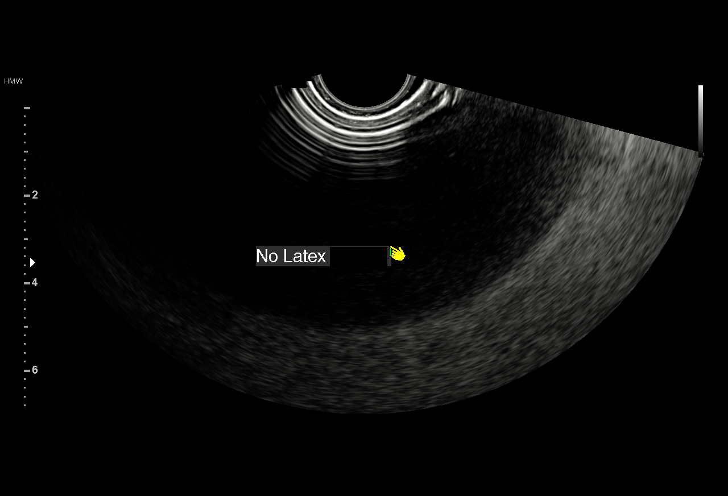
[im 3/33]
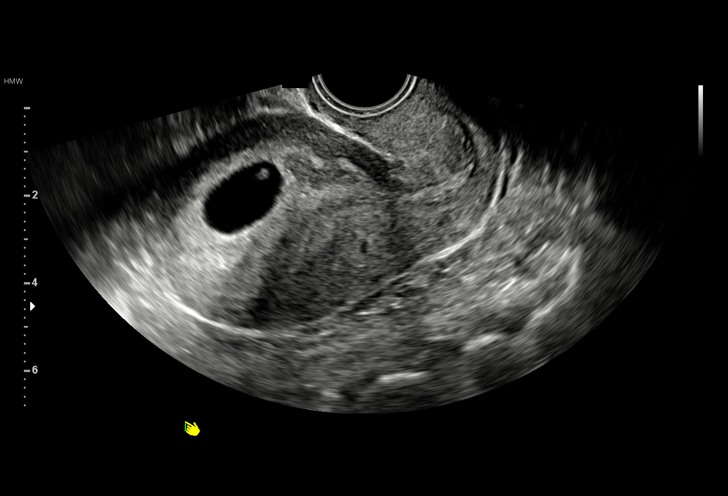
[im 5/33]
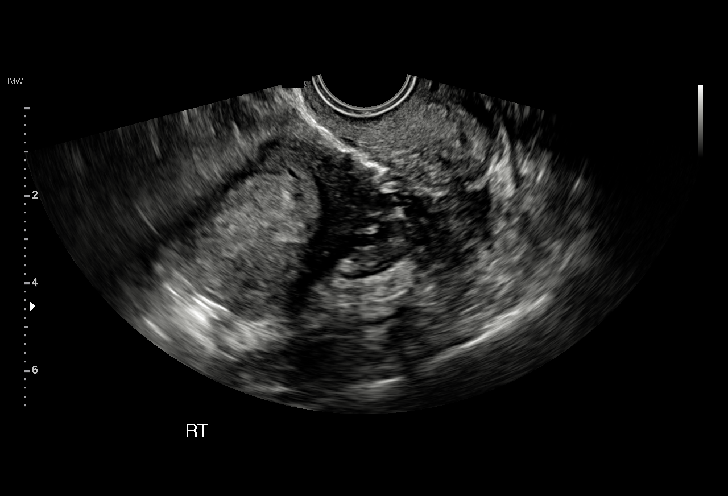
[im 8/33]
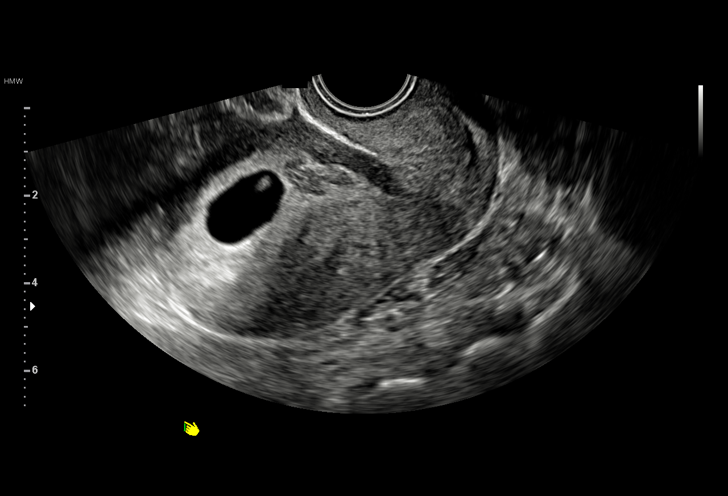
[im 10/33]
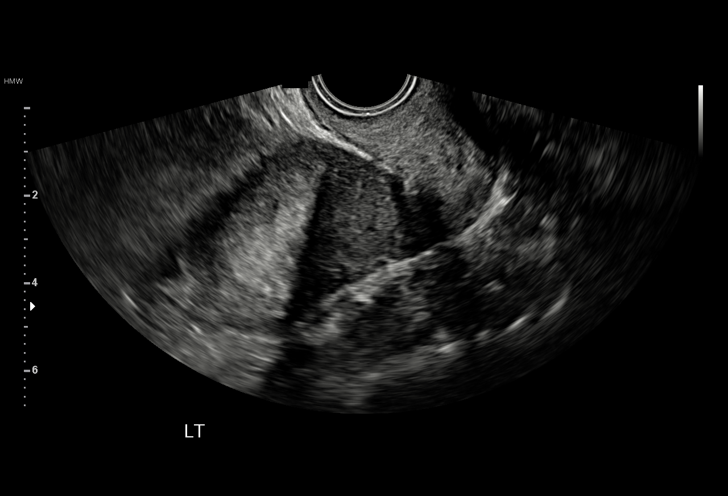
[im 12/33]
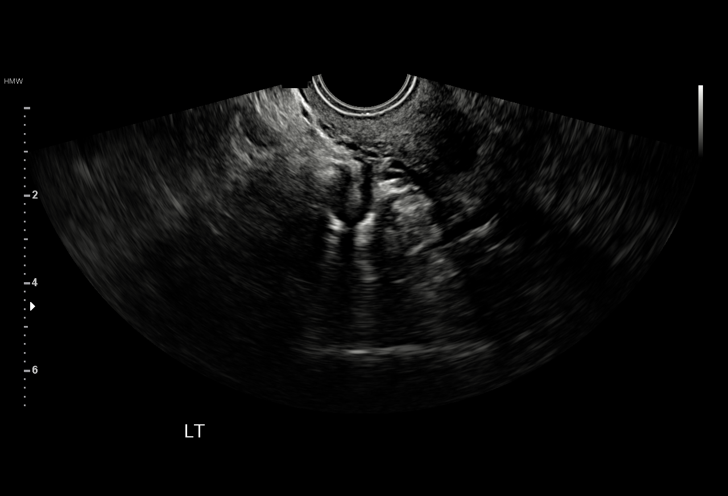
[im 15/33]
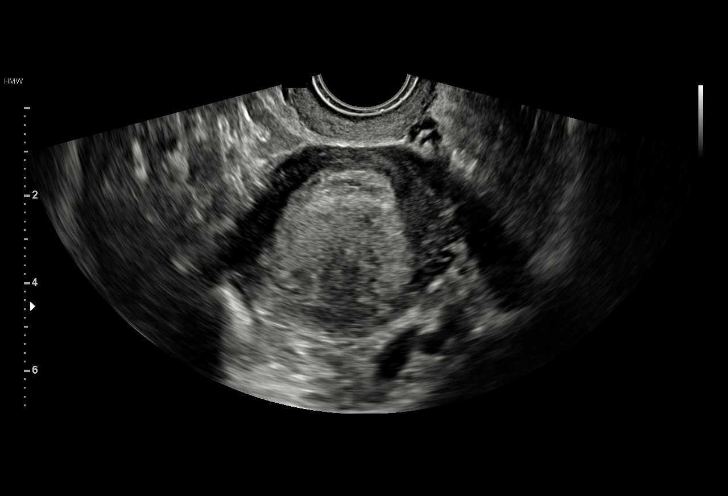
[im 17/33]
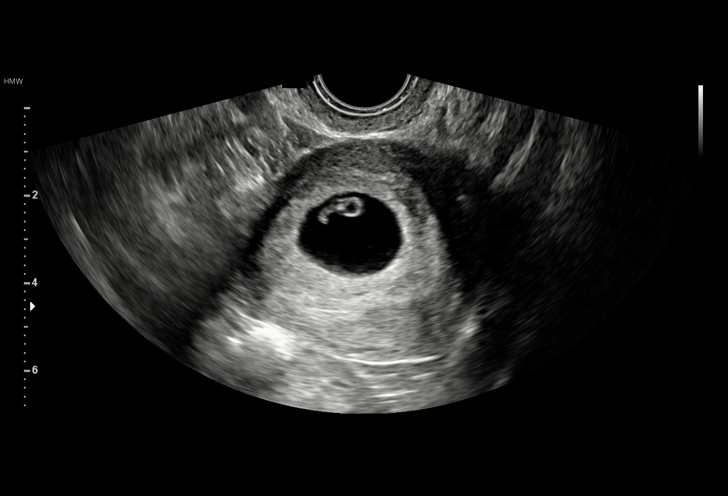
[im 18/33]
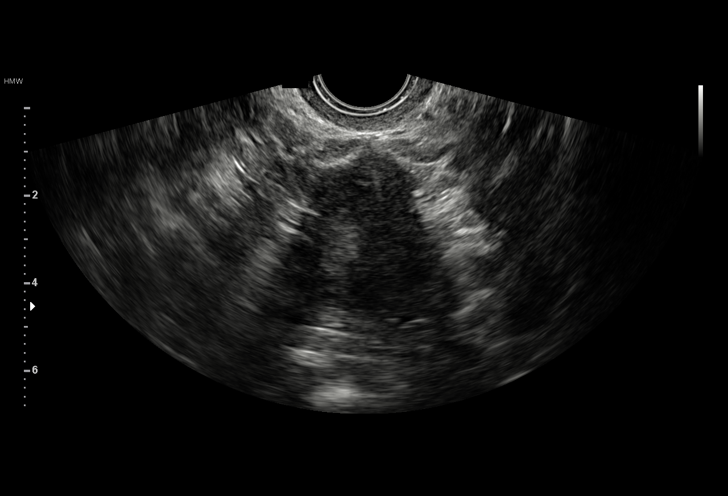
[im 21/33]
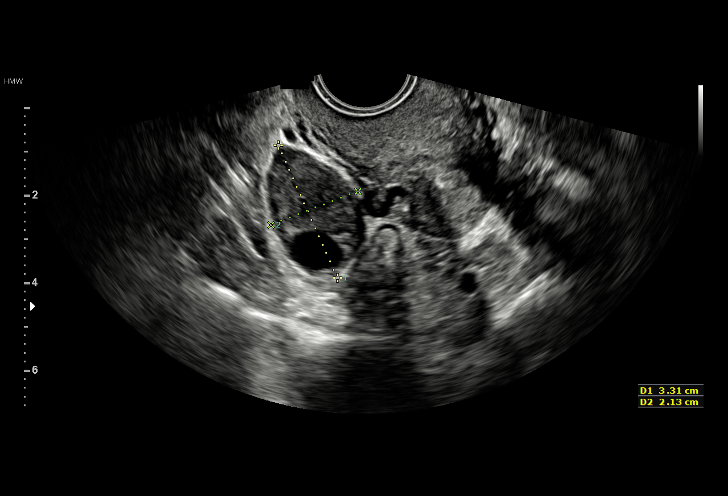
[im 23/33]
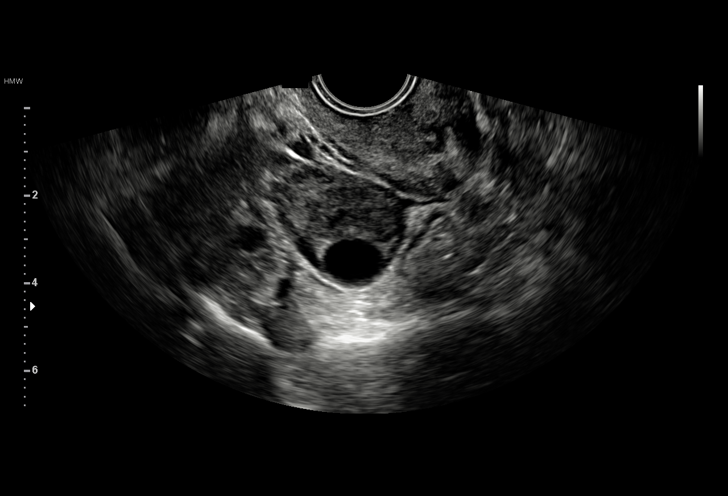
[im 25/33]
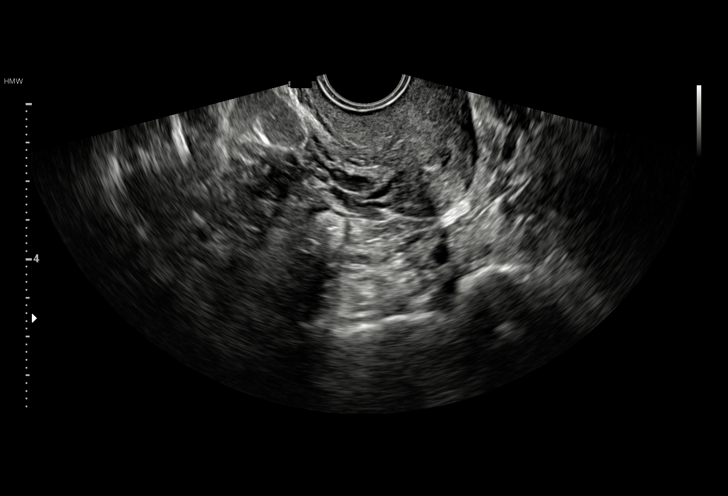
[im 28/33]
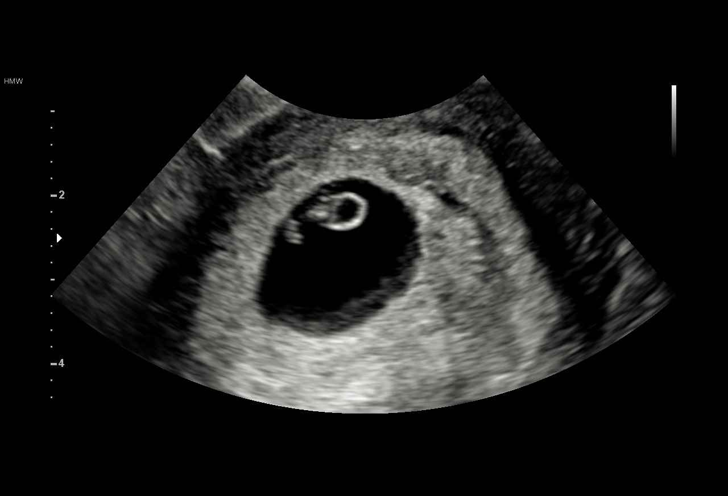
[im 30/33]
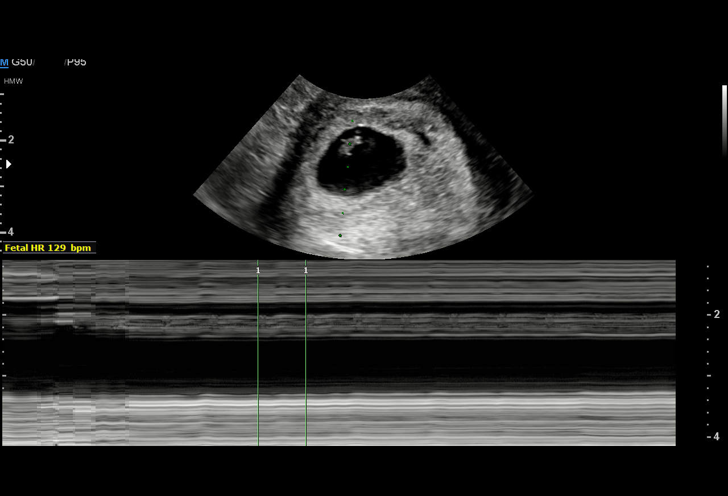
[im 33/33]
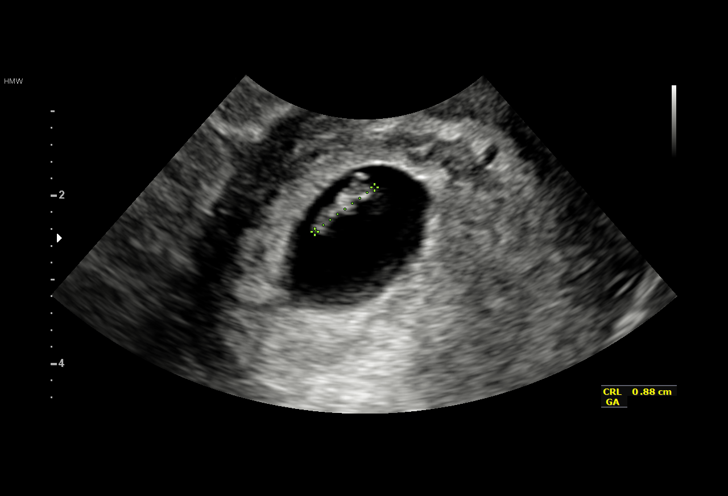

[15 of 28 positions shown; findings below may reference images not displayed]

FINDINGS: Intrauterine gestational sac: Visualized

Yolk sac:  Visualized

Embryo:  Visualized

Cardiac Activity: Visualized

Heart Rate: 129 bpm

CRL:   8.6  mm   6 w 5 d                  US EDC: 01/20/2018

Subchorionic hemorrhage:  None visualized.

Maternal uterus/adnexae: Normal appearing right ovary containing a
corpus luteum. Nonvisualized left ovary. Trace free peritoneal
fluid.
IMPRESSION: 1. Single live intrauterine gestation with an estimated gestational
age of 6 weeks and 5 days. No complicating features.
2. Nonvisualized maternal left ovary.

## 2019-03-25 IMAGING — US US OB TRANSVAGINAL
1 series · 13 of 28 positions shown · non-contrast
Comparison: None

CLINICAL DATA: Pelvic pain for 3 days, pregnant, quantitative beta
HCG = 291

EXAM:
OBSTETRIC <14 WK US AND TRANSVAGINAL OB US
TECHNIQUE: Both transabdominal and transvaginal ultrasound examinations were
performed for complete evaluation of the gestation as well as the
maternal uterus, adnexal regions, and pelvic cul-de-sac.
Transvaginal technique was performed to assess early pregnancy.

[Series 1: us ob transvaginal · 0.25mm/px · 72 acquisitions, 13 frames shown]
[im 3/72]
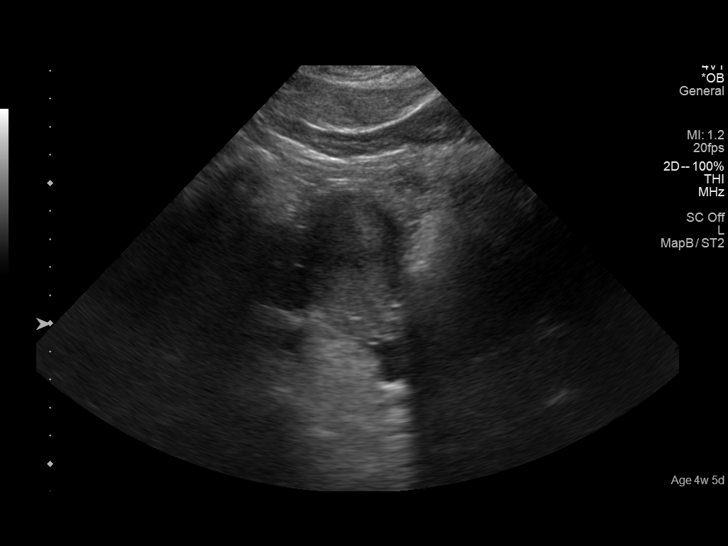
[im 8/72]
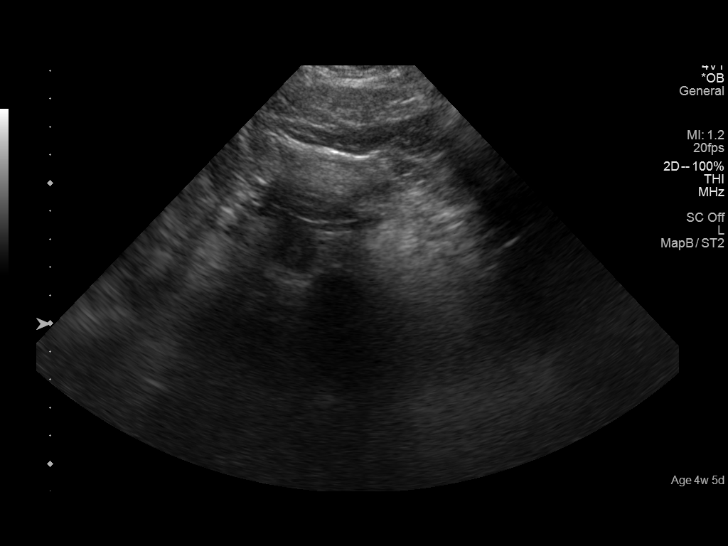
[im 14/72]
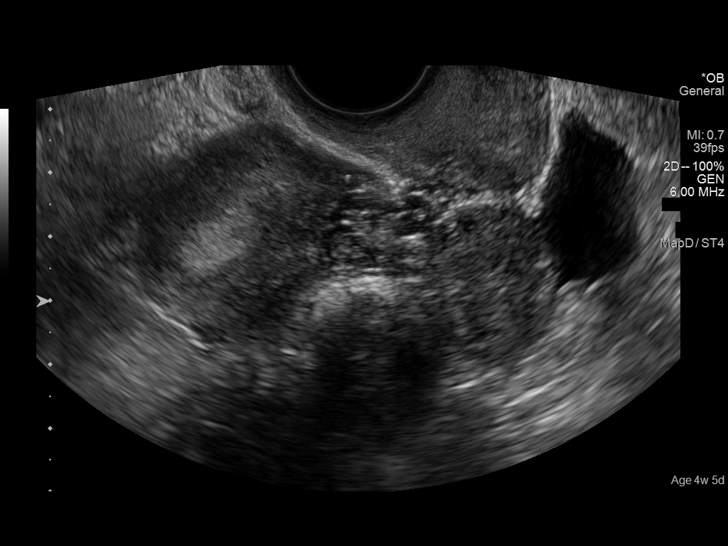
[im 19/72]
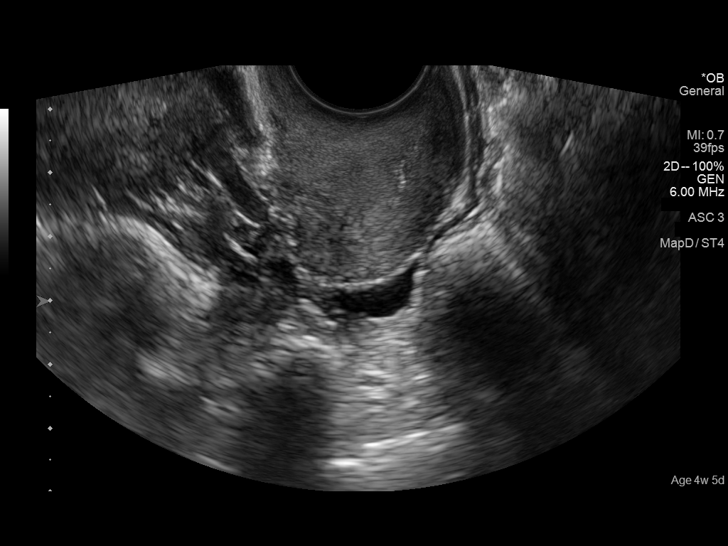
[im 24/72]
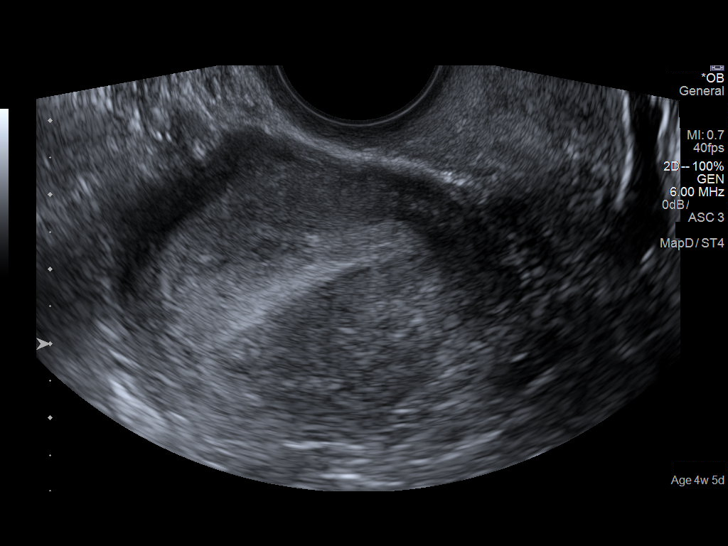
[im 29/72]
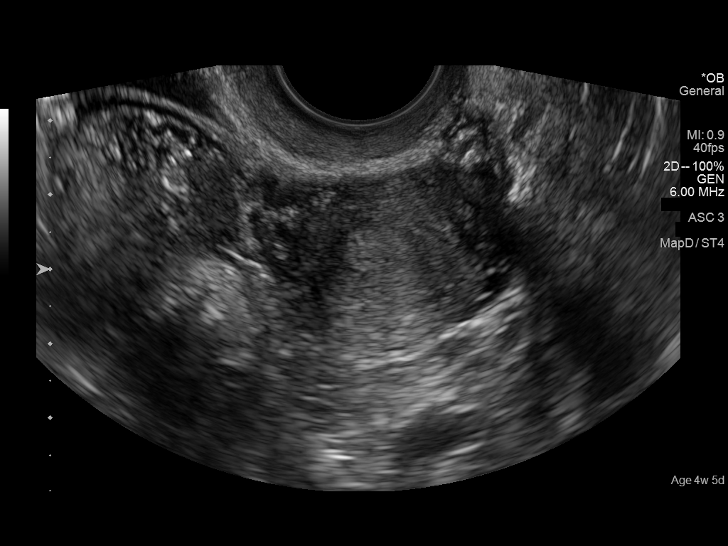
[im 37/72]
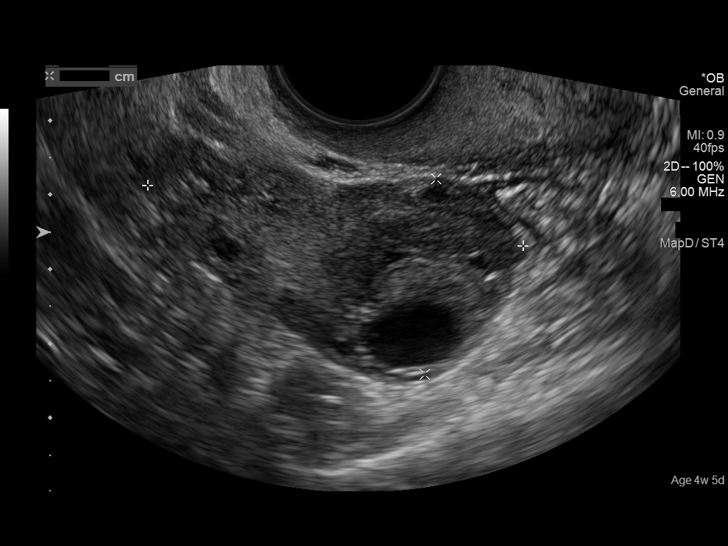
[im 43/72]
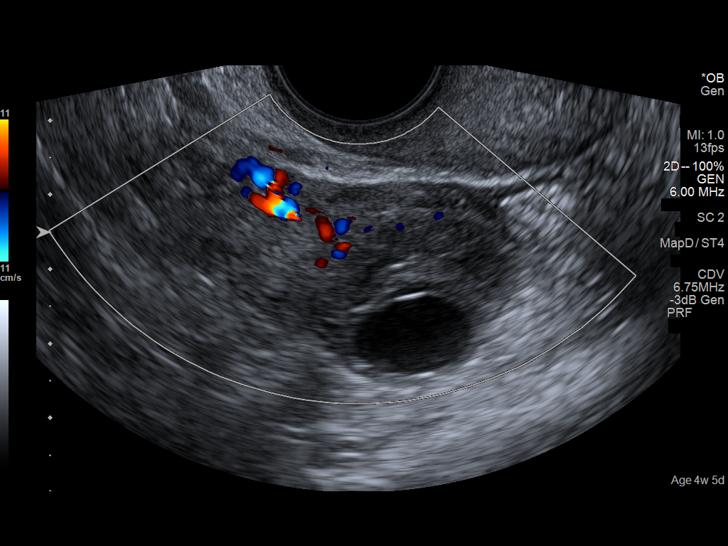
[im 48/72]
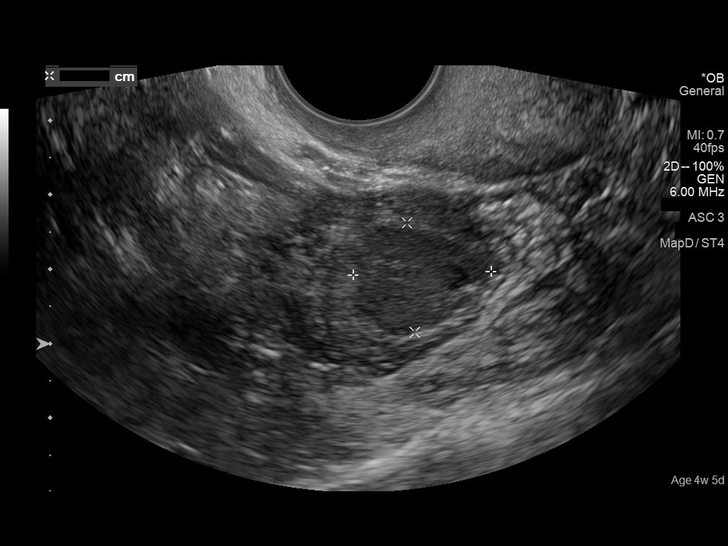
[im 53/72]
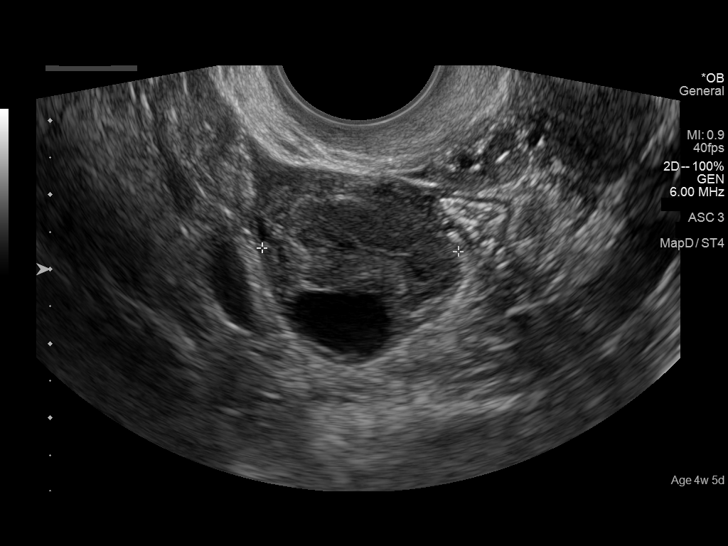
[im 58/72]
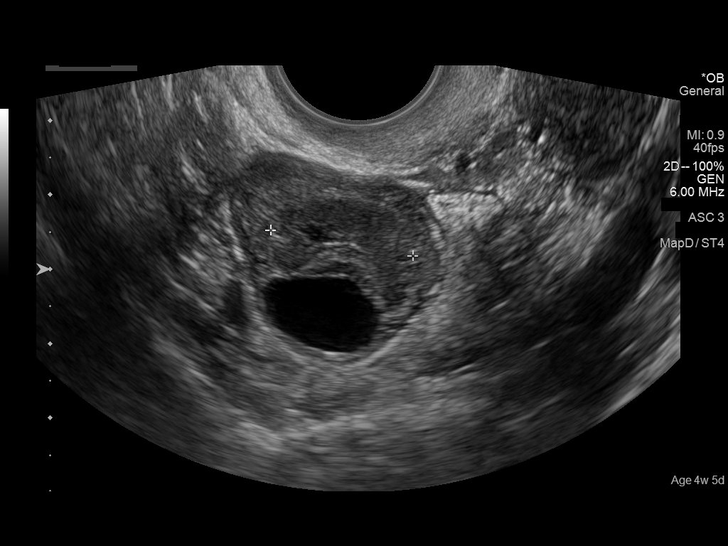
[im 64/72]
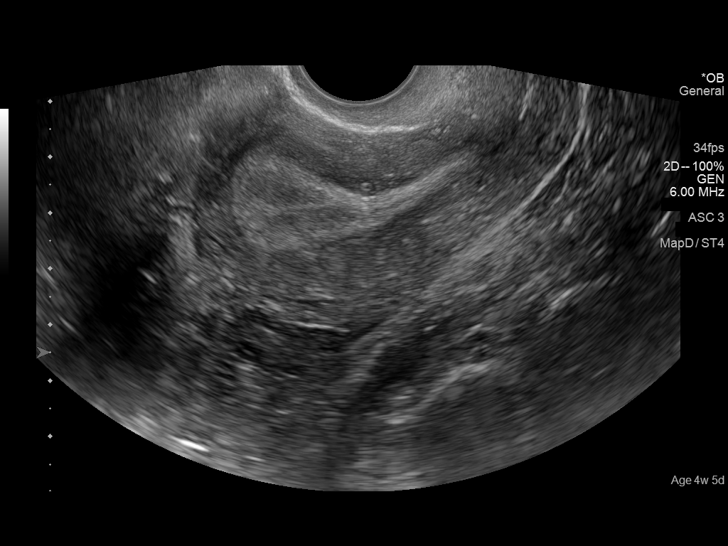
[im 69/72]
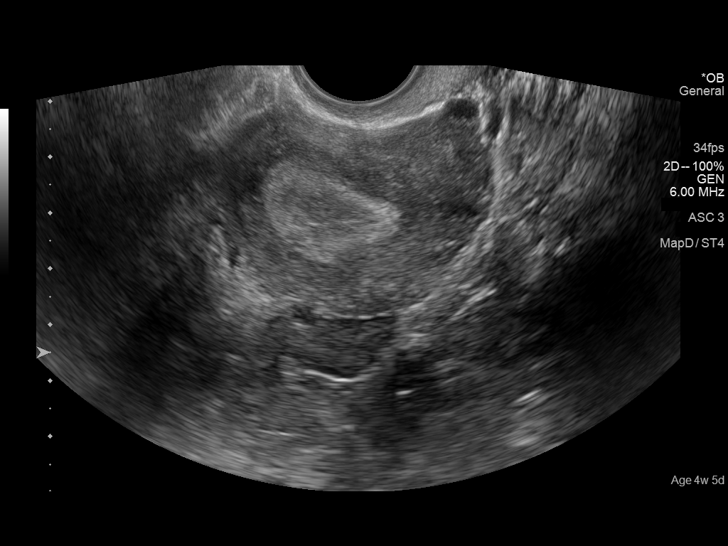

[13 of 28 positions shown; findings below may reference images not displayed]

FINDINGS: Intrauterine gestational sac: None identified

Yolk sac:  N/A

Embryo:  N/A

Cardiac Activity: N/A

Heart Rate: N/A  bpm

Maternal uterus/adnexae:

Prominent endometrial complex without gestational sac.

No endometrial fluid.

Trace free pelvic fluid.

LEFT ovary normal size and morphology, 3.4 x 1.2 x 2.1 cm.

RIGHT ovary measures 5.1 x 2.6 x 2.6 cm and contains a dominant
follicle 1.7 cm diameter as well as a probable hemorrhagic corpus
luteum 1.9 x 1.5 x 1.9 cm.

No adnexal masses otherwise seen.
IMPRESSION: No intrauterine gestation identified.

Findings are compatible with pregnancy of unknown location.

Differential diagnosis includes early intrauterine pregnancy too
early to visualize, spontaneous abortion, and ectopic pregnancy.

Serial quantitative beta HCG and or followup ultrasound recommended
to definitively exclude ectopic pregnancy.

## 2020-05-10 ENCOUNTER — Ambulatory Visit: Payer: Self-pay

## 2021-03-09 DIAGNOSIS — Z3201 Encounter for pregnancy test, result positive: Secondary | ICD-10-CM | POA: Diagnosis not present

## 2021-03-09 DIAGNOSIS — Z3A09 9 weeks gestation of pregnancy: Secondary | ICD-10-CM | POA: Diagnosis not present

## 2021-03-09 DIAGNOSIS — Z3481 Encounter for supervision of other normal pregnancy, first trimester: Secondary | ICD-10-CM | POA: Diagnosis not present

## 2021-03-09 DIAGNOSIS — O09291 Supervision of pregnancy with other poor reproductive or obstetric history, first trimester: Secondary | ICD-10-CM | POA: Diagnosis not present

## 2021-03-09 DIAGNOSIS — O09521 Supervision of elderly multigravida, first trimester: Secondary | ICD-10-CM | POA: Diagnosis not present

## 2021-03-09 DIAGNOSIS — Z3480 Encounter for supervision of other normal pregnancy, unspecified trimester: Secondary | ICD-10-CM | POA: Diagnosis not present

## 2021-03-09 LAB — OB RESULTS CONSOLE HIV ANTIBODY (ROUTINE TESTING): HIV: NONREACTIVE

## 2021-03-09 LAB — OB RESULTS CONSOLE HEPATITIS B SURFACE ANTIGEN: Hepatitis B Surface Ag: NEGATIVE

## 2021-03-09 LAB — OB RESULTS CONSOLE ABO/RH: RH Type: POSITIVE

## 2021-03-09 LAB — OB RESULTS CONSOLE RUBELLA ANTIBODY, IGM: Rubella: NON-IMMUNE/NOT IMMUNE

## 2021-03-15 DIAGNOSIS — Z3481 Encounter for supervision of other normal pregnancy, first trimester: Secondary | ICD-10-CM | POA: Diagnosis not present

## 2021-03-15 DIAGNOSIS — Z3490 Encounter for supervision of normal pregnancy, unspecified, unspecified trimester: Secondary | ICD-10-CM | POA: Diagnosis not present

## 2021-03-15 LAB — OB RESULTS CONSOLE GC/CHLAMYDIA
Chlamydia: NEGATIVE
Gonorrhea: NEGATIVE

## 2021-03-15 LAB — HEPATITIS C ANTIBODY: HCV Ab: NEGATIVE

## 2021-05-26 DIAGNOSIS — Z3A21 21 weeks gestation of pregnancy: Secondary | ICD-10-CM | POA: Diagnosis not present

## 2021-05-26 DIAGNOSIS — Z36 Encounter for antenatal screening for chromosomal anomalies: Secondary | ICD-10-CM | POA: Diagnosis not present

## 2021-05-26 DIAGNOSIS — O09522 Supervision of elderly multigravida, second trimester: Secondary | ICD-10-CM | POA: Diagnosis not present

## 2021-07-11 DIAGNOSIS — O26842 Uterine size-date discrepancy, second trimester: Secondary | ICD-10-CM | POA: Diagnosis not present

## 2021-07-11 DIAGNOSIS — O99212 Obesity complicating pregnancy, second trimester: Secondary | ICD-10-CM | POA: Diagnosis not present

## 2021-07-11 DIAGNOSIS — O09522 Supervision of elderly multigravida, second trimester: Secondary | ICD-10-CM | POA: Diagnosis not present

## 2021-07-12 DIAGNOSIS — Z3482 Encounter for supervision of other normal pregnancy, second trimester: Secondary | ICD-10-CM | POA: Diagnosis not present

## 2021-07-12 DIAGNOSIS — Z3480 Encounter for supervision of other normal pregnancy, unspecified trimester: Secondary | ICD-10-CM | POA: Diagnosis not present

## 2021-07-12 LAB — OB RESULTS CONSOLE RPR: RPR: NONREACTIVE

## 2021-07-19 DIAGNOSIS — Z3483 Encounter for supervision of other normal pregnancy, third trimester: Secondary | ICD-10-CM | POA: Diagnosis not present

## 2021-08-02 DIAGNOSIS — Z23 Encounter for immunization: Secondary | ICD-10-CM | POA: Diagnosis not present

## 2021-09-12 DIAGNOSIS — O99213 Obesity complicating pregnancy, third trimester: Secondary | ICD-10-CM | POA: Diagnosis not present

## 2021-09-12 DIAGNOSIS — Z3490 Encounter for supervision of normal pregnancy, unspecified, unspecified trimester: Secondary | ICD-10-CM | POA: Diagnosis not present

## 2021-09-12 DIAGNOSIS — R030 Elevated blood-pressure reading, without diagnosis of hypertension: Secondary | ICD-10-CM | POA: Diagnosis not present

## 2021-09-12 DIAGNOSIS — Z3483 Encounter for supervision of other normal pregnancy, third trimester: Secondary | ICD-10-CM | POA: Diagnosis not present

## 2021-09-12 DIAGNOSIS — O09523 Supervision of elderly multigravida, third trimester: Secondary | ICD-10-CM | POA: Diagnosis not present

## 2021-09-19 ENCOUNTER — Other Ambulatory Visit: Payer: Self-pay | Admitting: Obstetrics and Gynecology

## 2021-09-19 ENCOUNTER — Encounter (HOSPITAL_COMMUNITY): Payer: Self-pay | Admitting: Obstetrics and Gynecology

## 2021-09-19 DIAGNOSIS — O133 Gestational [pregnancy-induced] hypertension without significant proteinuria, third trimester: Secondary | ICD-10-CM | POA: Diagnosis not present

## 2021-09-19 DIAGNOSIS — O34219 Maternal care for unspecified type scar from previous cesarean delivery: Secondary | ICD-10-CM

## 2021-09-19 NOTE — Anesthesia Preprocedure Evaluation (Addendum)
Anesthesia Evaluation  Patient identified by MRN, date of birth, ID band Patient awake    Reviewed: Allergy & Precautions, H&P , NPO status , Patient's Chart, lab work & pertinent test results  History of Anesthesia Complications Negative for: history of anesthetic complications  Airway Mallampati: II  TM Distance: >3 FB Neck ROM: full    Dental no notable dental hx. (+) Teeth Intact   Pulmonary neg pulmonary ROS,    Pulmonary exam normal breath sounds clear to auscultation       Cardiovascular hypertension, Normal cardiovascular exam Rhythm:regular Rate:Normal     Neuro/Psych negative neurological ROS  negative psych ROS   GI/Hepatic negative GI ROS, Neg liver ROS,   Endo/Other  negative endocrine ROS  Renal/GU negative Renal ROS  negative genitourinary   Musculoskeletal   Abdominal   Peds  Hematology negative hematology ROS (+)   Anesthesia Other Findings   Reproductive/Obstetrics (+) Pregnancy                            Anesthesia Physical  Anesthesia Plan  ASA: 3  Anesthesia Plan: Spinal   Post-op Pain Management: Celebrex PO (pre-op) and Tylenol PO (pre-op)   Induction:   PONV Risk Score and Plan: 4 or greater and Ondansetron  Airway Management Planned: Natural Airway  Additional Equipment:   Intra-op Plan:   Post-operative Plan:   Informed Consent: I have reviewed the patients History and Physical, chart, labs and discussed the procedure including the risks, benefits and alternatives for the proposed anesthesia with the patient or authorized representative who has indicated his/her understanding and acceptance.     Dental advisory given  Plan Discussed with: Anesthesiologist and CRNA  Anesthesia Plan Comments:       Anesthesia Quick Evaluation

## 2021-09-19 NOTE — Pre-Procedure Instructions (Signed)
Message left on voicemail with NPO and arrival time.

## 2021-09-20 ENCOUNTER — Inpatient Hospital Stay (HOSPITAL_COMMUNITY): Payer: BC Managed Care – PPO | Admitting: Anesthesiology

## 2021-09-20 ENCOUNTER — Encounter (HOSPITAL_COMMUNITY)
Admission: RE | Disposition: A | Discharge status: Home / Self Care | Payer: Self-pay | Source: Home / Self Care | Attending: Obstetrics and Gynecology

## 2021-09-20 ENCOUNTER — Other Ambulatory Visit: Payer: Self-pay | Admitting: Obstetrics and Gynecology

## 2021-09-20 ENCOUNTER — Encounter (HOSPITAL_COMMUNITY): Payer: Self-pay | Admitting: Obstetrics and Gynecology

## 2021-09-20 ENCOUNTER — Inpatient Hospital Stay (HOSPITAL_COMMUNITY)
Admission: RE | Admit: 2021-09-20 | Discharge: 2021-09-22 | DRG: 785 | Disposition: A | Discharge status: Home / Self Care | Payer: BC Managed Care – PPO | Attending: Obstetrics and Gynecology | Admitting: Obstetrics and Gynecology

## 2021-09-20 ENCOUNTER — Other Ambulatory Visit: Payer: Self-pay

## 2021-09-20 DIAGNOSIS — O99214 Obesity complicating childbirth: Secondary | ICD-10-CM | POA: Diagnosis not present

## 2021-09-20 DIAGNOSIS — O34219 Maternal care for unspecified type scar from previous cesarean delivery: Secondary | ICD-10-CM | POA: Diagnosis not present

## 2021-09-20 DIAGNOSIS — O34211 Maternal care for low transverse scar from previous cesarean delivery: Secondary | ICD-10-CM | POA: Diagnosis not present

## 2021-09-20 DIAGNOSIS — O134 Gestational [pregnancy-induced] hypertension without significant proteinuria, complicating childbirth: Secondary | ICD-10-CM | POA: Diagnosis present

## 2021-09-20 DIAGNOSIS — Z23 Encounter for immunization: Secondary | ICD-10-CM | POA: Diagnosis not present

## 2021-09-20 DIAGNOSIS — Z98891 History of uterine scar from previous surgery: Secondary | ICD-10-CM

## 2021-09-20 DIAGNOSIS — Z3A37 37 weeks gestation of pregnancy: Secondary | ICD-10-CM

## 2021-09-20 DIAGNOSIS — Z20822 Contact with and (suspected) exposure to covid-19: Secondary | ICD-10-CM | POA: Diagnosis not present

## 2021-09-20 DIAGNOSIS — O4103X0 Oligohydramnios, third trimester, not applicable or unspecified: Secondary | ICD-10-CM | POA: Diagnosis not present

## 2021-09-20 DIAGNOSIS — O139 Gestational [pregnancy-induced] hypertension without significant proteinuria, unspecified trimester: Secondary | ICD-10-CM | POA: Diagnosis present

## 2021-09-20 DIAGNOSIS — Z302 Encounter for sterilization: Secondary | ICD-10-CM | POA: Diagnosis not present

## 2021-09-20 LAB — COMPREHENSIVE METABOLIC PANEL
ALT: 14 U/L (ref 0–44)
AST: 18 U/L (ref 15–41)
Albumin: 2.7 g/dL — ABNORMAL LOW (ref 3.5–5.0)
Alkaline Phosphatase: 113 U/L (ref 38–126)
Anion gap: 8 (ref 5–15)
BUN: <5 mg/dL — ABNORMAL LOW (ref 6–20)
CO2: 19 mmol/L — ABNORMAL LOW (ref 22–32)
Calcium: 8.9 mg/dL (ref 8.9–10.3)
Chloride: 109 mmol/L (ref 98–111)
Creatinine, Ser: 0.67 mg/dL (ref 0.44–1.00)
GFR, Estimated: >60 mL/min (ref >60)
Glucose, Bld: 81 mg/dL (ref 70–99)
Potassium: 3.4 mmol/L — ABNORMAL LOW (ref 3.5–5.1)
Sodium: 136 mmol/L (ref 135–145)
Total Bilirubin: 0.6 mg/dL (ref 0.3–1.2)
Total Protein: 6.6 g/dL (ref 6.5–8.1)

## 2021-09-20 LAB — CBC
HCT: 39.2 % (ref 36.0–46.0)
Hemoglobin: 13 g/dL (ref 12.0–15.0)
MCH: 27.2 pg (ref 26.0–34.0)
MCHC: 33.2 g/dL (ref 30.0–36.0)
MCV: 82 fL (ref 80.0–100.0)
Platelets: 419 10*3/uL — ABNORMAL HIGH (ref 150–400)
RBC: 4.78 MIL/uL (ref 3.87–5.11)
RDW: 14.3 % (ref 11.5–15.5)
WBC: 8.2 10*3/uL (ref 4.0–10.5)
nRBC: 0 % (ref 0.0–0.2)

## 2021-09-20 LAB — RESP PANEL BY RT-PCR (FLU A&B, COVID) ARPGX2
Influenza A by PCR: NEGATIVE
Influenza B by PCR: NEGATIVE
SARS Coronavirus 2 by RT PCR: NEGATIVE

## 2021-09-20 LAB — TYPE AND SCREEN
ABO/RH(D): AB POS
Antibody Screen: NEGATIVE

## 2021-09-20 SURGERY — Surgical Case
Anesthesia: Spinal | Wound class: Clean Contaminated

## 2021-09-20 MED ORDER — PRENATAL MULTIVITAMIN CH
1.0000 | ORAL_TABLET | Freq: Every day | ORAL | Status: DC
  Administered 2021-09-21: 14:00:00 1 via ORAL
  Filled 2021-09-20: qty 1

## 2021-09-20 MED ORDER — LACTATED RINGERS IV SOLN: INTRAVENOUS | Status: DC

## 2021-09-20 MED ORDER — MENTHOL 3 MG MT LOZG: 1.0000 | LOZENGE | OROMUCOSAL | Status: DC | PRN

## 2021-09-20 MED ORDER — COCONUT OIL OIL: 1.0000 "application " | TOPICAL_OIL | Status: DC | PRN

## 2021-09-20 MED ORDER — FENTANYL CITRATE (PF) 100 MCG/2ML IJ SOLN
INTRAMUSCULAR | Status: DC | PRN
  Administered 2021-09-20: 15 ug via INTRATHECAL

## 2021-09-20 MED ORDER — IBUPROFEN 600 MG PO TABS
600.0000 mg | ORAL_TABLET | Freq: Four times a day (QID) | ORAL | Status: DC
  Administered 2021-09-21 – 2021-09-22 (×3): 600 mg via ORAL
  Filled 2021-09-20 (×3): qty 1

## 2021-09-20 MED ORDER — DIBUCAINE (PERIANAL) 1 % EX OINT: 1.0000 "application " | TOPICAL_OINTMENT | CUTANEOUS | Status: DC | PRN

## 2021-09-20 MED ORDER — STERILE WATER FOR IRRIGATION IR SOLN
Status: DC | PRN
  Administered 2021-09-20: 1000 mL

## 2021-09-20 MED ORDER — DIPHENHYDRAMINE HCL 25 MG PO CAPS
25.0000 mg | ORAL_CAPSULE | ORAL | Status: DC | PRN
  Filled 2021-09-20: qty 1

## 2021-09-20 MED ORDER — SOD CITRATE-CITRIC ACID 500-334 MG/5ML PO SOLN
ORAL | Status: AC
  Filled 2021-09-20: qty 30

## 2021-09-20 MED ORDER — DIPHENHYDRAMINE HCL 25 MG PO CAPS
25.0000 mg | ORAL_CAPSULE | Freq: Four times a day (QID) | ORAL | Status: DC | PRN
  Administered 2021-09-21: 16:00:00 25 mg via ORAL

## 2021-09-20 MED ORDER — SOD CITRATE-CITRIC ACID 500-334 MG/5ML PO SOLN
30.0000 mL | ORAL | Status: AC
  Administered 2021-09-20: 17:00:00 30 mL via ORAL

## 2021-09-20 MED ORDER — POTASSIUM CHLORIDE 20 MEQ PO PACK
20.0000 meq | PACK | Freq: Two times a day (BID) | ORAL | Status: AC
  Administered 2021-09-20 – 2021-09-21 (×2): 20 meq via ORAL
  Filled 2021-09-20 (×3): qty 1

## 2021-09-20 MED ORDER — ENOXAPARIN SODIUM 60 MG/0.6ML IJ SOSY
50.0000 mg | PREFILLED_SYRINGE | INTRAMUSCULAR | Status: DC
  Administered 2021-09-21: 17:00:00 50 mg via SUBCUTANEOUS
  Filled 2021-09-20: qty 0.6

## 2021-09-20 MED ORDER — NALBUPHINE HCL 10 MG/ML IJ SOLN
10.0000 mg | Freq: Four times a day (QID) | INTRAMUSCULAR | Status: DC | PRN
  Administered 2021-09-21 (×2): 10 mg via INTRAVENOUS
  Filled 2021-09-20 (×2): qty 1

## 2021-09-20 MED ORDER — FENTANYL CITRATE (PF) 100 MCG/2ML IJ SOLN
25.0000 ug | INTRAMUSCULAR | Status: DC | PRN
  Administered 2021-09-20: 19:00:00 50 ug via INTRAVENOUS

## 2021-09-20 MED ORDER — MEPERIDINE HCL 25 MG/ML IJ SOLN: 6.2500 mg | INTRAMUSCULAR | Status: DC | PRN

## 2021-09-20 MED ORDER — MORPHINE SULFATE (PF) 0.5 MG/ML IJ SOLN
INTRAMUSCULAR | Status: AC
  Filled 2021-09-20: qty 10

## 2021-09-20 MED ORDER — OXYTOCIN-SODIUM CHLORIDE 30-0.9 UT/500ML-% IV SOLN
2.5000 [IU]/h | INTRAVENOUS | Status: AC
  Administered 2021-09-21: 01:00:00 2.5 [IU]/h via INTRAVENOUS
  Filled 2021-09-20: qty 500

## 2021-09-20 MED ORDER — PHENYLEPHRINE HCL-NACL 20-0.9 MG/250ML-% IV SOLN
INTRAVENOUS | Status: DC | PRN
  Administered 2021-09-20: 60 ug/min via INTRAVENOUS

## 2021-09-20 MED ORDER — ACETAMINOPHEN 500 MG PO TABS
1000.0000 mg | ORAL_TABLET | Freq: Four times a day (QID) | ORAL | Status: DC
  Administered 2021-09-20 – 2021-09-22 (×7): 1000 mg via ORAL
  Filled 2021-09-20 (×7): qty 2

## 2021-09-20 MED ORDER — AMISULPRIDE (ANTIEMETIC) 5 MG/2ML IV SOLN: 10.0000 mg | Freq: Once | INTRAVENOUS | Status: DC | PRN

## 2021-09-20 MED ORDER — SODIUM CHLORIDE 0.9% FLUSH: 3.0000 mL | INTRAVENOUS | Status: DC | PRN

## 2021-09-20 MED ORDER — ONDANSETRON HCL 4 MG/2ML IJ SOLN
INTRAMUSCULAR | Status: AC
  Filled 2021-09-20: qty 2

## 2021-09-20 MED ORDER — FENTANYL CITRATE (PF) 100 MCG/2ML IJ SOLN
INTRAMUSCULAR | Status: AC
  Filled 2021-09-20: qty 2

## 2021-09-20 MED ORDER — BUPIVACAINE IN DEXTROSE 0.75-8.25 % IT SOLN
INTRATHECAL | Status: DC | PRN
  Administered 2021-09-20: 1.6 mL via INTRATHECAL

## 2021-09-20 MED ORDER — HYDROMORPHONE HCL 1 MG/ML IJ SOLN: 0.2000 mg | INTRAMUSCULAR | Status: DC | PRN

## 2021-09-20 MED ORDER — MORPHINE SULFATE (PF) 0.5 MG/ML IJ SOLN
INTRAMUSCULAR | Status: DC | PRN
  Administered 2021-09-20: 150 ug via INTRATHECAL

## 2021-09-20 MED ORDER — ZOLPIDEM TARTRATE 5 MG PO TABS: 5.0000 mg | ORAL_TABLET | Freq: Every evening | ORAL | Status: DC | PRN

## 2021-09-20 MED ORDER — POVIDONE-IODINE 10 % EX SWAB: 2.0000 "application " | Freq: Once | CUTANEOUS | Status: DC

## 2021-09-20 MED ORDER — ONDANSETRON HCL 4 MG/2ML IJ SOLN: 4.0000 mg | Freq: Three times a day (TID) | INTRAMUSCULAR | Status: DC | PRN

## 2021-09-20 MED ORDER — CEFAZOLIN SODIUM-DEXTROSE 2-4 GM/100ML-% IV SOLN
INTRAVENOUS | Status: AC
  Filled 2021-09-20: qty 100

## 2021-09-20 MED ORDER — PROMETHAZINE HCL 25 MG/ML IJ SOLN: 6.2500 mg | INTRAMUSCULAR | Status: DC | PRN

## 2021-09-20 MED ORDER — WITCH HAZEL-GLYCERIN EX PADS: 1.0000 "application " | MEDICATED_PAD | CUTANEOUS | Status: DC | PRN

## 2021-09-20 MED ORDER — KETOROLAC TROMETHAMINE 30 MG/ML IJ SOLN
30.0000 mg | Freq: Four times a day (QID) | INTRAMUSCULAR | Status: AC
  Administered 2021-09-20 – 2021-09-21 (×4): 30 mg via INTRAVENOUS
  Filled 2021-09-20 (×4): qty 1

## 2021-09-20 MED ORDER — DIPHENHYDRAMINE HCL 50 MG/ML IJ SOLN
12.5000 mg | INTRAMUSCULAR | Status: DC | PRN
  Administered 2021-09-20: 21:00:00 12.5 mg via INTRAVENOUS
  Filled 2021-09-20: qty 1

## 2021-09-20 MED ORDER — SODIUM CHLORIDE 0.9 % IR SOLN
Status: DC | PRN
  Administered 2021-09-20: 1000 mL

## 2021-09-20 MED ORDER — SENNOSIDES-DOCUSATE SODIUM 8.6-50 MG PO TABS
2.0000 | ORAL_TABLET | Freq: Every day | ORAL | Status: DC
  Administered 2021-09-21 – 2021-09-22 (×2): 2 via ORAL
  Filled 2021-09-20 (×2): qty 2

## 2021-09-20 MED ORDER — ONDANSETRON HCL 4 MG/2ML IJ SOLN
INTRAMUSCULAR | Status: DC | PRN
  Administered 2021-09-20: 4 mg via INTRAVENOUS

## 2021-09-20 MED ORDER — OXYCODONE HCL 5 MG PO TABS
5.0000 mg | ORAL_TABLET | ORAL | Status: DC | PRN
  Administered 2021-09-21 – 2021-09-22 (×2): 5 mg via ORAL
  Filled 2021-09-20 (×2): qty 1

## 2021-09-20 MED ORDER — SIMETHICONE 80 MG PO CHEW
80.0000 mg | CHEWABLE_TABLET | Freq: Three times a day (TID) | ORAL | Status: DC
  Administered 2021-09-21 – 2021-09-22 (×4): 80 mg via ORAL
  Filled 2021-09-20 (×4): qty 1

## 2021-09-20 MED ORDER — SIMETHICONE 80 MG PO CHEW: 80.0000 mg | CHEWABLE_TABLET | ORAL | Status: DC | PRN

## 2021-09-20 MED ORDER — SCOPOLAMINE 1 MG/3DAYS TD PT72: 1.0000 | MEDICATED_PATCH | Freq: Once | TRANSDERMAL | Status: DC

## 2021-09-20 MED ORDER — NALOXONE HCL 4 MG/10ML IJ SOLN
1.0000 ug/kg/h | INTRAVENOUS | Status: DC | PRN
  Filled 2021-09-20: qty 5

## 2021-09-20 MED ORDER — FERROUS SULFATE 325 (65 FE) MG PO TABS
325.0000 mg | ORAL_TABLET | Freq: Two times a day (BID) | ORAL | Status: DC
  Administered 2021-09-21 – 2021-09-22 (×3): 325 mg via ORAL
  Filled 2021-09-20 (×3): qty 1

## 2021-09-20 MED ORDER — NALOXONE HCL 0.4 MG/ML IJ SOLN: 0.4000 mg | INTRAMUSCULAR | Status: DC | PRN

## 2021-09-20 MED ORDER — OXYTOCIN-SODIUM CHLORIDE 30-0.9 UT/500ML-% IV SOLN
INTRAVENOUS | Status: DC | PRN
  Administered 2021-09-20: 400 mL via INTRAVENOUS

## 2021-09-20 MED ORDER — PHENYLEPHRINE HCL-NACL 20-0.9 MG/250ML-% IV SOLN
INTRAVENOUS | Status: AC
  Filled 2021-09-20: qty 250

## 2021-09-20 MED ORDER — CEFAZOLIN SODIUM-DEXTROSE 2-4 GM/100ML-% IV SOLN
2.0000 g | INTRAVENOUS | Status: AC
  Administered 2021-09-20: 17:00:00 2 g via INTRAVENOUS

## 2021-09-20 SURGICAL SUPPLY — 42 items
BARRIER ADHS 3X4 INTERCEED (GAUZE/BANDAGES/DRESSINGS) ×1 IMPLANT
BENZOIN TINCTURE PRP APPL 2/3 (GAUZE/BANDAGES/DRESSINGS) ×2 IMPLANT
CHLORAPREP W/TINT 26ML (MISCELLANEOUS) ×2 IMPLANT
CLAMP CORD UMBIL (MISCELLANEOUS) IMPLANT
CLOTH BEACON ORANGE TIMEOUT ST (SAFETY) ×2 IMPLANT
DRAPE C SECTION CLR SCREEN (DRAPES) ×1 IMPLANT
DRSG OPSITE POSTOP 4X10 (GAUZE/BANDAGES/DRESSINGS) ×2 IMPLANT
ELECT REM PT RETURN 9FT ADLT (ELECTROSURGICAL) ×2
ELECTRODE REM PT RTRN 9FT ADLT (ELECTROSURGICAL) ×1 IMPLANT
EXTRACTOR VACUUM KIWI (MISCELLANEOUS) IMPLANT
GAUZE SPONGE 4X4 12PLY STRL LF (GAUZE/BANDAGES/DRESSINGS) ×2 IMPLANT
GLOVE BIOGEL M 7.0 STRL (GLOVE) ×4 IMPLANT
GLOVE BIOGEL PI IND STRL 7.0 (GLOVE) ×3 IMPLANT
GLOVE BIOGEL PI INDICATOR 7.0 (GLOVE) ×3
GOWN STRL REUS W/TWL LRG LVL3 (GOWN DISPOSABLE) ×6 IMPLANT
HOVERMATT SINGLE USE (MISCELLANEOUS) ×1 IMPLANT
KIT ABG SYR 3ML LUER SLIP (SYRINGE) IMPLANT
LIGASURE IMPACT 36 18CM CVD LR (INSTRUMENTS) ×1 IMPLANT
NDL HYPO 25X5/8 SAFETYGLIDE (NEEDLE) IMPLANT
NEEDLE HYPO 25X5/8 SAFETYGLIDE (NEEDLE) IMPLANT
NS IRRIG 1000ML POUR BTL (IV SOLUTION) ×2 IMPLANT
PACK C SECTION WH (CUSTOM PROCEDURE TRAY) ×2 IMPLANT
PAD ABD 7.5X8 STRL (GAUZE/BANDAGES/DRESSINGS) ×1 IMPLANT
PAD OB MATERNITY 4.3X12.25 (PERSONAL CARE ITEMS) ×2 IMPLANT
RETAINER VISCERAL (MISCELLANEOUS) ×1 IMPLANT
RETRACTOR TRAXI PANNICULUS (MISCELLANEOUS) IMPLANT
RTRCTR C-SECT PINK 25CM LRG (MISCELLANEOUS) IMPLANT
STRIP CLOSURE SKIN 1/2X4 (GAUZE/BANDAGES/DRESSINGS) ×2 IMPLANT
SUT MNCRL 0 VIOLET CTX 36 (SUTURE) ×2 IMPLANT
SUT MONOCRYL 0 CTX 36 (SUTURE) ×2
SUT PDS AB 0 CT1 27 (SUTURE) ×4 IMPLANT
SUT PLAIN 0 NONE (SUTURE) IMPLANT
SUT PLAIN 2 0 XLH (SUTURE) ×1 IMPLANT
SUT VIC AB 2-0 CT1 27 (SUTURE) ×1
SUT VIC AB 2-0 CT1 TAPERPNT 27 (SUTURE) ×1 IMPLANT
SUT VIC AB 3-0 SH 27 (SUTURE)
SUT VIC AB 3-0 SH 27X BRD (SUTURE) IMPLANT
SUT VIC AB 4-0 KS 27 (SUTURE) ×2 IMPLANT
TOWEL OR 17X24 6PK STRL BLUE (TOWEL DISPOSABLE) ×2 IMPLANT
TRAXI PANNICULUS RETRACTOR (MISCELLANEOUS) ×1
TRAY FOLEY W/BAG SLVR 14FR LF (SET/KITS/TRAYS/PACK) ×2 IMPLANT
WATER STERILE IRR 1000ML POUR (IV SOLUTION) ×2 IMPLANT

## 2021-09-20 NOTE — Anesthesia Procedure Notes (Signed)
Spinal  Patient location during procedure: OR Start time: 09/20/2021 4:56 PM End time: 09/20/2021 5:04 PM Reason for block: surgical anesthesia Staffing Performed: anesthesiologist  Anesthesiologist: Heather Roberts, MD Preanesthetic Checklist Completed: patient identified, IV checked, risks and benefits discussed, surgical consent, monitors and equipment checked, pre-op evaluation and timeout performed Spinal Block Patient position: sitting Prep: DuraPrep Patient monitoring: cardiac monitor, continuous pulse ox and blood pressure Approach: midline Location: L2-3 Injection technique: single-shot Needle Needle type: Pencan  Needle gauge: 24 G Needle length: 9 cm Assessment Events: CSF return Additional Notes Functioning IV was confirmed and monitors were applied. Sterile prep and drape, including hand hygiene and sterile gloves were used. The patient was positioned and the spine was prepped. The skin was anesthetized with lidocaine.  Free flow of clear CSF was obtained prior to injecting local anesthetic into the CSF.  The spinal needle aspirated freely following injection.  The needle was carefully withdrawn.  The patient tolerated the procedure well.

## 2021-09-20 NOTE — H&P (Deleted)
  The note originally documented on this encounter has been moved the the encounter in which it belongs.  

## 2021-09-20 NOTE — H&P (Signed)
Margaret Mason is a 35 y.o. female G3P1011 at 37 weeks and 5 days presenting for repeat cesarean section due to h/o cesarean section and current gestational hypertension. Pt was seen in the office on 09/19/2021 and bp was 135/93. Her bp on 09/12/2021 was 141/88. She denies headache visual disturbances or ruq pain. She desires permanent sterilization at the time of cesarean section. Her pregnancy has also been complicated by AMA and obesity. Prenatal care provided by Dr. Gerald Leitz with [facility]. . OB History     Gravida  2   Para      Term      Preterm      AB  1   Living         SAB  1   IAB      Ectopic      Multiple      Live Births             Past Medical History:  Diagnosis Date   Overactive thyroid gland    Currently euthyroid  Past Surgical History:  Procedure Laterality Date   CESAREAN SECTION N/A 01/03/2018   Procedure: CESAREAN SECTION;  Surgeon: Myna Hidalgo, DO;  Location: [facility];  Service: Obstetrics;  Laterality: N/A;   NO PAST SURGERIES     Family History: family history includes Asthma in her sister; Cancer in her maternal aunt; Heart disease in her mother. Social History:  reports that she has never smoked. She has never used smokeless tobacco. She reports that she does not drink alcohol and does not use drugs.     Maternal Diabetes: No Genetic Screening: Declined Maternal Ultrasounds/Referrals: Normal Fetal Ultrasounds or other Referrals:  None Maternal Substance Abuse:  No Significant Maternal Medications:  None Significant Maternal Lab Results:  Group B Strep negative Other Comments:  None  Review of Systems  Constitutional: Negative.   HENT: Negative.    Eyes: Negative.   Respiratory: Negative.    Cardiovascular: Negative.   Gastrointestinal: Negative.   Endocrine: Negative.   Genitourinary: Negative.   Musculoskeletal: Negative.   Skin: Negative.   Allergic/Immunologic: Negative.   Neurological: Negative.    Hematological: Negative.   Psychiatric/Behavioral: Negative.    History   unknown if currently breastfeeding. Maternal Exam:  Introitus: Normal vulva.  Physical Exam Vitals reviewed.  Constitutional:      Appearance: Normal appearance.  HENT:     Head: Normocephalic and atraumatic.     Nose: Nose normal.     Mouth/Throat:     Mouth: Mucous membranes are moist.  Eyes:     Pupils: Pupils are equal, round, and reactive to light.  Cardiovascular:     Rate and Rhythm: Normal rate and regular rhythm.     Pulses: Normal pulses.     Heart sounds: Normal heart sounds.  Pulmonary:     Effort: Pulmonary effort is normal.     Breath sounds: Normal breath sounds.  Abdominal:     Tenderness: There is no abdominal tenderness.  Genitourinary:    General: Normal vulva.  Musculoskeletal:        General: Swelling present.     Cervical back: Normal range of motion and neck supple.  Skin:    General: Skin is warm and dry.  Neurological:     General: No focal deficit present.     Mental Status: She is alert and oriented to person, place, and time.  Psychiatric:        Mood and Affect:  Mood normal.        Behavior: Behavior normal.    Prenatal labs: ABO, Rh: AB/Positive/-- (06/08 0000) Antibody:  Negative  Rubella: Nonimmune (06/08 0000) RPR: Nonreactive (10/11 0000)  HBsAg: Negative (06/08 0000)  HIV: Non-reactive (06/08 0000)  GBS:  Negative on 09/12/2021  Assessment/Plan: 37 weeks and 5 days with gestational hypertension h/o cesarean section pt desires repeat and declines TOLAC. She desires bilateral salpingectomy at the time of cesarean section. She understands that this is a permanent form of sterilization.   R/B/A of cesarean section  and bilateral salpingectomy discussed with the patient including but not limited to infection, bleeding damage to bowel bladder and baby with the need for further surgery. R/O transfusion HIV/ Hep B&C discussed. Pt voiced understanding and desires  to proceed with cesarean section.     Gerald Leitz 09/20/2021, 10:25 AM

## 2021-09-20 NOTE — Transfer of Care (Signed)
Immediate Anesthesia Transfer of Care Note  Patient: Margaret Mason  Procedure(s) Performed: CESAREAN SECTION WITH BILATERAL SALPINGECTOMY  Patient Location: PACU  Anesthesia Type:Spinal  Level of Consciousness: awake  Airway & Oxygen Therapy: Patient Spontanous Breathing  Post-op Assessment: Report given to RN and Post -op Vital signs reviewed and stable  Post vital signs: Reviewed and stable  Last Vitals:  Vitals Value Taken Time  BP 116/68 09/20/21 1830  Temp    Pulse 103 09/20/21 1831  Resp 20 09/20/21 1831  SpO2 99 % 09/20/21 1831  Vitals shown include unvalidated device data.  Last Pain:  Vitals:   09/20/21 1500  TempSrc:   PainSc: 0-No pain         Complications: No notable events documented.

## 2021-09-20 NOTE — Op Note (Signed)
Cesarean Section  with bilateral salpingectomy Procedure Note  Indications:  h/o cesarean section with gestation hypertension. Pt desires repeat and permanent sterilization via bilateral salpingectomy   Pre-operative Diagnosis: 37 week 5 day pregnancy.  Post-operative Diagnosis: same  Surgeon: Gerald Leitz M.D.  Assistants: Dr. Steva Ready assisted due to complexity of the anatomy and concern for adhesive disease   Anesthesia: Spinal anesthesia  ASA Class: 2   Procedure Details   The patient was seen in the Holding Room. The risks, benefits, complications, treatment options, and expected outcomes were discussed with the patient.  The patient concurred with the proposed plan, giving informed consent.  The site of surgery properly noted/marked. The patient was taken to Operating Room # C, identified as [place] and the procedure verified as C-Section Delivery. A Time Out was held and the above information confirmed.  After induction of anesthesia, the patient was draped and prepped in the usual sterile manner. A Pfannenstiel incision was made and carried down through the subcutaneous tissue to the fascia. Fascial incision was made and extended transversely. The fascia was separated from the underlying rectus tissue superiorly and inferiorly. The peritoneum was identified and entered. Peritoneal incision was extended longitudinally. The utero-vesical peritoneal reflection was incised transversely and the bladder flap was bluntly freed from the lower uterine segment. A low transverse uterine incision was made. Delivered from cephalic presentation was a 2990 gram Female with Apgar scores of 8 at one minute and 8 at five minutes. After the umbilical cord was clamped and cut cord blood was obtained for evaluation. The placenta was removed intact and appeared normal. The uterine outline, tubes and ovaries appeared normal. The uterine incision was closed with running locked sutures of  0 monocyrl  .  Hemostasis was observed. Lavage was carried out until clear.   Attention was turned to the left fallopian tube which was grasped with a babcock clamp. The mesosalpinx was cauterized and transected with ligasure. This was repeated on the right fallopian tube. .Interseed was placed along the uterine inicision.   The fascia was then reapproximated with running sutures of  0 pds  . The skin was reapproximated with  4-0 vicryl 2 .  Instrument, sponge, and needle counts were correct prior the abdominal closure and at the conclusion of the case.   Findings: Normal fallopian tubes and ovaries. Female fetus in cephalic presentation. Clear amniotic fluid   Estimated Blood Loss:   400 mL         Drains: None         Total IV Fluids:   per anesthesia ml         Specimens:  placenta to labor and deliver.. bilateral fallopian tubes to pathology            Implants: NOne         Complications:  None; patient tolerated the procedure well.         Disposition: PACU - hemodynamically stable.         Condition: stable  Attending Attestation: I performed the procedure.

## 2021-09-20 NOTE — Anesthesia Postprocedure Evaluation (Signed)
Anesthesia Post Note  Patient: Location manager  Procedure(s) Performed: CESAREAN SECTION WITH BILATERAL SALPINGECTOMY     Patient location during evaluation: PACU Anesthesia Type: Spinal Level of consciousness: oriented and awake and alert Pain management: pain level controlled Vital Signs Assessment: post-procedure vital signs reviewed and stable Respiratory status: spontaneous breathing, respiratory function stable and patient connected to nasal cannula oxygen Cardiovascular status: blood pressure returned to baseline and stable Postop Assessment: no headache, no backache, no apparent nausea or vomiting and spinal receding Anesthetic complications: no   No notable events documented.  Last Vitals:  Vitals:   09/20/21 2105 09/20/21 2225  BP: 111/81 134/88  Pulse: 77 79  Resp: 19 18  Temp: (!) 36.3 C 36.8 C  SpO2: 99% 96%    Last Pain:  Vitals:   09/20/21 2225  TempSrc: Oral  PainSc: 3    Pain Goal: Patients Stated Pain Goal: 2 (09/20/21 1930)              Epidural/Spinal Function Cutaneous sensation: Able to Wiggle Toes (09/20/21 2225), Patient able to flex knees: Yes (09/20/21 2225), Patient able to lift hips off bed: Yes (09/20/21 2225), Back pain beyond tenderness at insertion site: No (09/20/21 2225), Progressively worsening motor and/or sensory loss: No (09/20/21 2225), Bowel and/or bladder incontinence post epidural: No (09/20/21 2225)  Kaedance Magos L Syd Newsome

## 2021-09-20 NOTE — Progress Notes (Signed)
°   09/20/21 2225 09/20/21 2330  Vital Signs  BP 134/88 137/83  BP Location Right Arm Left Wrist  Patient Position (if appropriate) Lying Semi-fowlers  BP Method Automatic Automatic  Pulse Rate 79 89   RN notified Dr. Su Hilt about pt's recent BP results. Given verbal orders to obtain a urine protein creatinine and place a CMP lab draw in the morning. RN will continue to monitor pt and report off information to on coming shift.    Herbert Moors, RN

## 2021-09-20 NOTE — H&P (Signed)
Date of Initial H&P: 09/20/2021  History reviewed, patient examined, no change in status, stable for surgery.

## 2021-09-21 DIAGNOSIS — O34219 Maternal care for unspecified type scar from previous cesarean delivery: Secondary | ICD-10-CM | POA: Diagnosis present

## 2021-09-21 LAB — COMPREHENSIVE METABOLIC PANEL
ALT: 13 U/L (ref 0–44)
AST: 18 U/L (ref 15–41)
Albumin: 2.1 g/dL — ABNORMAL LOW (ref 3.5–5.0)
Alkaline Phosphatase: 80 U/L (ref 38–126)
Anion gap: 7 (ref 5–15)
BUN: <5 mg/dL — ABNORMAL LOW (ref 6–20)
CO2: 21 mmol/L — ABNORMAL LOW (ref 22–32)
Calcium: 8.2 mg/dL — ABNORMAL LOW (ref 8.9–10.3)
Chloride: 108 mmol/L (ref 98–111)
Creatinine, Ser: 0.7 mg/dL (ref 0.44–1.00)
GFR, Estimated: >60 mL/min (ref >60)
Glucose, Bld: 120 mg/dL — ABNORMAL HIGH (ref 70–99)
Potassium: 3.5 mmol/L (ref 3.5–5.1)
Sodium: 136 mmol/L (ref 135–145)
Total Bilirubin: 0.8 mg/dL (ref 0.3–1.2)
Total Protein: 5.2 g/dL — ABNORMAL LOW (ref 6.5–8.1)

## 2021-09-21 LAB — PROTEIN / CREATININE RATIO, URINE
Creatinine, Urine: 152.57 mg/dL
Protein Creatinine Ratio: 0.19 mg/mg{Cre} — ABNORMAL HIGH (ref 0.00–0.15)
Total Protein, Urine: 29 mg/dL

## 2021-09-21 LAB — CBC
HCT: 36 % (ref 36.0–46.0)
Hemoglobin: 12.7 g/dL (ref 12.0–15.0)
MCH: 28.7 pg (ref 26.0–34.0)
MCHC: 35.3 g/dL (ref 30.0–36.0)
MCV: 81.3 fL (ref 80.0–100.0)
Platelets: 372 10*3/uL (ref 150–400)
RBC: 4.43 MIL/uL (ref 3.87–5.11)
RDW: 14.1 % (ref 11.5–15.5)
WBC: 12.9 10*3/uL — ABNORMAL HIGH (ref 4.0–10.5)
nRBC: 0 % (ref 0.0–0.2)

## 2021-09-21 LAB — RPR: RPR Ser Ql: NONREACTIVE

## 2021-09-21 MED ORDER — NIFEDIPINE ER 30 MG PO TB24
30.0000 mg | ORAL_TABLET | Freq: Every day | ORAL | 1 refills | Status: AC
Start: 2021-09-21 — End: ?

## 2021-09-21 MED ORDER — NIFEDIPINE ER OSMOTIC RELEASE 30 MG PO TB24
30.0000 mg | ORAL_TABLET | Freq: Every day | ORAL | Status: DC
  Administered 2021-09-21 – 2021-09-22 (×2): 30 mg via ORAL
  Filled 2021-09-21 (×2): qty 1

## 2021-09-21 MED ORDER — IBUPROFEN 600 MG PO TABS
600.0000 mg | ORAL_TABLET | Freq: Four times a day (QID) | ORAL | 0 refills | Status: AC | PRN
Start: 2021-09-21 — End: ?

## 2021-09-21 MED ORDER — OXYCODONE HCL 5 MG PO TABS
5.0000 mg | ORAL_TABLET | ORAL | 0 refills | Status: AC | PRN
Start: 2021-09-21 — End: ?

## 2021-09-21 MED ORDER — ACETAMINOPHEN 500 MG PO TABS: 1000.0000 mg | ORAL_TABLET | Freq: Three times a day (TID) | ORAL | 0 refills | Status: AC | PRN

## 2021-09-21 NOTE — Progress Notes (Signed)
Subjective: Postpartum Day 1: Cesarean Delivery Patient reports incisional pain, tolerating PO, and no problems voiding.    Objective: Vital signs in last 24 hours: Temp:  [97.3 F (36.3 C)-98.4 F (36.9 C)] 98.4 F (36.9 C) (12/21 1435) Pulse Rate:  [73-114] 85 (12/21 1435) Resp:  [15-19] 18 (12/21 1435) BP: (102-143)/(68-91) 143/83 (12/21 1435) SpO2:  [95 %-100 %] 99 % (12/21 1435)  Physical Exam:  General: alert, cooperative, and no distress Lochia: appropriate Uterine Fundus: firm Incision: bandage clean dry and intact  DVT Evaluation: No evidence of DVT seen on physical exam.  Recent Labs    09/20/21 1441 09/21/21 0722  HGB 13.0 12.7  HCT 39.2 36.0    Assessment/Plan: Status post Cesarean section. Doing well postoperatively.  Gestational hypertension.. mildly elevated bp will start procardia xl 30 mg daily .  Plan discharge home tomorrow    Gerald Leitz 09/21/2021, 3:59 PM

## 2021-09-22 ENCOUNTER — Encounter (HOSPITAL_COMMUNITY): Payer: Self-pay | Admitting: Obstetrics and Gynecology

## 2021-09-22 LAB — SURGICAL PATHOLOGY

## 2021-09-22 NOTE — Progress Notes (Signed)
Subjective: Postpartum Day 2: Cesarean Delivery Patient reports mild incisional pain but otherwise doing well. Desires to go home this evening or early morning pending how she feels today. Denies fevers, chills, chest pain, visual changes, SOB, RUQ/epigastric pain, N/V, dysuria, hematuria, or sudden onset/worsening bilateral LE or facial edema.   Objective: Vital signs in last 24 hours: Temp:  [98.1 F (36.7 C)-98.4 F (36.9 C)] 98.3 F (36.8 C) (12/22 0500) Pulse Rate:  [82-88] 82 (12/22 0500) Resp:  [18-20] 18 (12/22 0500) BP: (115-149)/(67-90) 115/67 (12/22 0500) SpO2:  [98 %-100 %] 100 % (12/22 0500)  Physical Exam:  General: alert, cooperative, and no distress Cardio:  RRR Lungs: CTAB, no wheezes/rales/rhonchi Lochia: appropriate Uterine Fundus: firm Incision: honeycomb dressing saturated  DVT Evaluation: No evidence of DVT seen on physical exam.  Recent Labs    09/20/21 1441 09/21/21 0722  HGB 13.0 12.7  HCT 39.2 36.0    Assessment/Plan: Status post Cesarean section. Doing well postoperatively.  Gestational hypertension - Procardia 30mg  XL daily started on 12/21 Honeycomb dressing to be changed Abdominal binder ordered Continue routine postpartum care  Dispo: D/C home today   1/22 09/22/2021, 7:52 AM

## 2021-09-22 NOTE — Discharge Summary (Signed)
Postpartum Discharge Summary  Date of Service: 09/22/21    Patient Name: Margaret Mason DOB: Oct 16, 1985 MRN: [id number]  Date of admission: 09/20/2021 Delivery date:09/20/2021  Delivering provider: Christophe Louis  Date of discharge: 09/22/2021  Admitting diagnosis: Gestational hypertension [O13.9] Intrauterine pregnancy: [redacted]w[redacted]d    Secondary diagnosis:  Principal Problem:   Gestational hypertension Active Problems:   History of cesarean section complicating pregnancy  Additional problems: Obesity (BMI 45)   Discharge diagnosis: Term Pregnancy Delivered and Gestational Hypertension                                              Post partum procedures: None Augmentation: N/A Complications: None  Hospital course: Sceduled C/S   35y.o. yo G3P1011 at 375w5das admitted to the hospital 09/20/2021 for scheduled cesarean section with the following indication:Elective Repeat.Delivery details are as follows:  Membrane Rupture Time/Date: 3:29 PM ,09/20/2021   Delivery Method:C-Section, Low Transverse  Details of operation can be found in separate operative note.  Patient had an uncomplicated postpartum course.  She is ambulating, tolerating a regular diet, passing flatus, and urinating well. Patient is discharged home in stable condition on  09/22/21        Newborn Data: Birth date:09/20/2021  Birth time:5:29 PM  Gender:Female  Living status:Living  Apgars:8 ,8  Weight:2990 g     Magnesium Sulfate received: No BMZ received: No Rhophylac:N/A MMR:N/A T-DaP:Given prenatally Transfusion:No  Physical exam  Vitals:   09/21/21 1015 09/21/21 1435 09/21/21 2115 09/22/21 0500  BP: 123/83 (!) 143/83 (!) 149/90 115/67  Pulse:  85 88 82  Resp: 18 18 20 18   Temp: 98.1 F (36.7 C) 98.4 F (36.9 C) 98.3 F (36.8 C) 98.3 F (36.8 C)  TempSrc: Oral Oral Oral Oral  SpO2: 98% 99% 99% 100%  Weight:      Height:       General: alert, cooperative, and no distress Cardio: RRR Lungs: CTAB, no  wheezes/rales/rhonchi Lochia: appropriate Uterine Fundus: firm Incision: Dressing saturated - to be changed prior to discharge DVT Evaluation: No evidence of DVT seen on physical exam. No cords or calf tenderness. Labs: Lab Results  Component Value Date   WBC 12.9 (H) 09/21/2021   HGB 12.7 09/21/2021   HCT 36.0 09/21/2021   MCV 81.3 09/21/2021   PLT 372 09/21/2021   CMP Latest Ref Rng & Units 09/21/2021  Glucose 70 - 99 mg/dL 120(H)  BUN 6 - 20 mg/dL <5(L)  Creatinine 0.44 - 1.00 mg/dL 0.70  Sodium 135 - 145 mmol/L 136  Potassium 3.5 - 5.1 mmol/L 3.5  Chloride 98 - 111 mmol/L 108  CO2 22 - 32 mmol/L 21(L)  Calcium 8.9 - 10.3 mg/dL 8.2(L)  Total Protein 6.5 - 8.1 g/dL 5.2(L)  Total Bilirubin 0.3 - 1.2 mg/dL 0.8  Alkaline Phos 38 - 126 U/L 80  AST 15 - 41 U/L 18  ALT 0 - 44 U/L 13   Edinburgh Score: Edinburgh Postnatal Depression Scale Screening Tool 01/04/2018  I have been able to laugh and see the funny side of things. 0  I have looked forward with enjoyment to things. 0  I have blamed myself unnecessarily when things went wrong. 2  I have been anxious or worried for no good reason. 0  I have felt scared or panicky for no good reason. 0  Things have been getting on  top of me. 2  I have been so unhappy that I have had difficulty sleeping. 0  I have felt sad or miserable. 0  I have been so unhappy that I have been crying. 0  The thought of harming myself has occurred to me. 0  Edinburgh Postnatal Depression Scale Total 4      After visit meds:  Allergies as of 09/22/2021   No Known Allergies      Medication List     TAKE these medications    acetaminophen 500 MG tablet Commonly known as: TYLENOL Take 2 tablets (1,000 mg total) by mouth every 8 (eight) hours as needed.   ibuprofen 600 MG tablet Commonly known as: ADVIL Take 1 tablet (600 mg total) by mouth every 6 (six) hours as needed.   NIFEdipine 30 MG 24 hr tablet Commonly known as: ADALAT CC Take 1  tablet (30 mg total) by mouth daily.   oxyCODONE 5 MG immediate release tablet Commonly known as: Oxy IR/ROXICODONE Take 1-2 tablets (5-10 mg total) by mouth every 4 (four) hours as needed for moderate pain.   prenatal multivitamin Tabs tablet Take 1 tablet by mouth daily at 12 noon.         Discharge home in stable condition Infant Feeding: Bottle Infant Disposition:home with mother Discharge instruction: per After Visit Summary and Postpartum booklet. Activity: Advance as tolerated. Pelvic rest for 6 weeks.  Diet: routine diet Anticipated Birth Control:  Status post bilateral salpingectomy at the time of CS Postpartum Appointment:6 weeks Additional Postpartum F/U: Incision check 1 week Future Appointments:No future appointments. Follow up Visit:  Follow-up Information     Christophe Louis, MD Follow up in 1 week(s).   Specialty: Obstetrics and Gynecology Why: please make an appt for nurse visit in 1 week for bp check.. incision check with Dr. Landry Mellow in 2 weeks Contact information: [address] [place] [address] [phone]                     09/22/2021 Drema Dallas, DO

## 2021-10-01 ENCOUNTER — Telehealth (HOSPITAL_COMMUNITY): Payer: Self-pay

## 2021-10-01 NOTE — Telephone Encounter (Signed)
°  No answer. Left message to return nurse call.  Marcelino Duster [facility] 10/01/2021,0926

## 2021-10-05 ENCOUNTER — Encounter (HOSPITAL_COMMUNITY): Payer: Self-pay | Admitting: Obstetrics and Gynecology
